# Patient Record
Sex: Female | Born: 1995 | Race: Black or African American | Hispanic: No | Marital: Single | Smoking: Never smoker
Health system: Southern US, Community
[De-identification: ages and names within clinical notes are randomized; demographics above are authoritative.]

## PROBLEM LIST (undated history)

## (undated) ENCOUNTER — Emergency Department (HOSPITAL_COMMUNITY): Admission: EM | Payer: Medicaid Other

## (undated) ENCOUNTER — Inpatient Hospital Stay (HOSPITAL_COMMUNITY): Payer: Self-pay

## (undated) DIAGNOSIS — A749 Chlamydial infection, unspecified: Secondary | ICD-10-CM

## (undated) DIAGNOSIS — F419 Anxiety disorder, unspecified: Secondary | ICD-10-CM

## (undated) DIAGNOSIS — F32A Depression, unspecified: Secondary | ICD-10-CM

## (undated) DIAGNOSIS — F329 Major depressive disorder, single episode, unspecified: Secondary | ICD-10-CM

## (undated) HISTORY — PX: CHEST SURGERY: SHX595

---
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
## 2015-12-22 ENCOUNTER — Encounter (HOSPITAL_COMMUNITY): Payer: Self-pay | Admitting: Emergency Medicine

## 2015-12-22 ENCOUNTER — Emergency Department (HOSPITAL_COMMUNITY)
Admission: EM | Admit: 2015-12-22 | Discharge: 2015-12-22 | Disposition: A | Discharge status: Home / Self Care | Payer: No Typology Code available for payment source | Attending: Emergency Medicine | Admitting: Emergency Medicine

## 2015-12-22 ENCOUNTER — Emergency Department (HOSPITAL_COMMUNITY): Payer: No Typology Code available for payment source

## 2015-12-22 DIAGNOSIS — Y939 Activity, unspecified: Secondary | ICD-10-CM | POA: Diagnosis not present

## 2015-12-22 DIAGNOSIS — R51 Headache: Secondary | ICD-10-CM | POA: Diagnosis not present

## 2015-12-22 DIAGNOSIS — Y92410 Unspecified street and highway as the place of occurrence of the external cause: Secondary | ICD-10-CM | POA: Insufficient documentation

## 2015-12-22 DIAGNOSIS — IMO0002 Reserved for concepts with insufficient information to code with codable children: Secondary | ICD-10-CM

## 2015-12-22 DIAGNOSIS — S199XXA Unspecified injury of neck, initial encounter: Secondary | ICD-10-CM | POA: Diagnosis not present

## 2015-12-22 DIAGNOSIS — S01111A Laceration without foreign body of right eyelid and periocular area, initial encounter: Secondary | ICD-10-CM | POA: Diagnosis not present

## 2015-12-22 DIAGNOSIS — Y999 Unspecified external cause status: Secondary | ICD-10-CM | POA: Diagnosis not present

## 2015-12-22 DIAGNOSIS — S0181XA Laceration without foreign body of other part of head, initial encounter: Secondary | ICD-10-CM | POA: Diagnosis present

## 2015-12-22 MED ORDER — ACETAMINOPHEN 500 MG PO TABS
1000.0000 mg | ORAL_TABLET | Freq: Once | ORAL | Status: AC
  Administered 2015-12-22: 1000 mg via ORAL
  Filled 2015-12-22: qty 2

## 2015-12-22 MED ORDER — IBUPROFEN 800 MG PO TABS
800.0000 mg | ORAL_TABLET | Freq: Once | ORAL | Status: AC
  Administered 2015-12-22: 800 mg via ORAL
  Filled 2015-12-22: qty 1

## 2015-12-22 MED ORDER — LIDOCAINE-EPINEPHRINE (PF) 1 %-1:200000 IJ SOLN
INTRAMUSCULAR | Status: AC
  Filled 2015-12-22: qty 30

## 2015-12-22 MED ORDER — LIDOCAINE-EPINEPHRINE-TETRACAINE (LET) SOLUTION
3.0000 mL | Freq: Once | NASAL | Status: DC
  Filled 2015-12-22: qty 3
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
## 2015-12-22 NOTE — Discharge Instructions (Signed)
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
## 2017-06-14 LAB — OB RESULTS CONSOLE HIV ANTIBODY (ROUTINE TESTING): HIV: NONREACTIVE

## 2017-06-14 LAB — OB RESULTS CONSOLE HEPATITIS B SURFACE ANTIGEN: Hepatitis B Surface Ag: NEGATIVE

## 2017-06-14 LAB — OB RESULTS CONSOLE GC/CHLAMYDIA
Chlamydia: NEGATIVE
Gonorrhea: POSITIVE

## 2017-06-14 LAB — OB RESULTS CONSOLE RPR: RPR: NONREACTIVE

## 2017-06-14 LAB — OB RESULTS CONSOLE RUBELLA ANTIBODY, IGM: Rubella: IMMUNE
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
## 2017-10-15 LAB — OB RESULTS CONSOLE RPR: RPR: NONREACTIVE

## 2017-10-15 LAB — OB RESULTS CONSOLE GC/CHLAMYDIA
CHLAMYDIA, DNA PROBE: NEGATIVE
GC PROBE AMP, GENITAL: NEGATIVE

## 2017-10-15 LAB — OB RESULTS CONSOLE HIV ANTIBODY (ROUTINE TESTING): HIV: NONREACTIVE

## 2017-11-07 ENCOUNTER — Encounter (HOSPITAL_COMMUNITY): Payer: Self-pay | Admitting: *Deleted

## 2017-11-07 ENCOUNTER — Inpatient Hospital Stay
Admission: AD | Admit: 2017-11-07 | Discharge: 2017-11-12 | DRG: 832 | Disposition: A | Discharge status: Home / Self Care | Payer: Medicaid Other | Source: Intra-hospital | Attending: Psychiatry | Admitting: Psychiatry

## 2017-11-07 ENCOUNTER — Other Ambulatory Visit: Payer: Self-pay

## 2017-11-07 ENCOUNTER — Inpatient Hospital Stay (HOSPITAL_COMMUNITY)
Admission: AD | Admit: 2017-11-07 | Discharge: 2017-11-07 | Disposition: A | Discharge status: Other Acute Inpatient Hospital | Payer: Medicaid Other | Source: Ambulatory Visit | Attending: Obstetrics and Gynecology | Admitting: Obstetrics and Gynecology

## 2017-11-07 DIAGNOSIS — F329 Major depressive disorder, single episode, unspecified: Secondary | ICD-10-CM | POA: Insufficient documentation

## 2017-11-07 DIAGNOSIS — F322 Major depressive disorder, single episode, severe without psychotic features: Secondary | ICD-10-CM | POA: Diagnosis present

## 2017-11-07 DIAGNOSIS — R45851 Suicidal ideations: Secondary | ICD-10-CM | POA: Diagnosis present

## 2017-11-07 DIAGNOSIS — F32A Depression, unspecified: Secondary | ICD-10-CM | POA: Diagnosis present

## 2017-11-07 DIAGNOSIS — O99343 Other mental disorders complicating pregnancy, third trimester: Principal | ICD-10-CM | POA: Diagnosis present

## 2017-11-07 DIAGNOSIS — Z3A30 30 weeks gestation of pregnancy: Secondary | ICD-10-CM | POA: Insufficient documentation

## 2017-11-07 DIAGNOSIS — O99340 Other mental disorders complicating pregnancy, unspecified trimester: Secondary | ICD-10-CM

## 2017-11-07 HISTORY — DX: Major depressive disorder, single episode, unspecified: F32.9

## 2017-11-07 HISTORY — DX: Depression, unspecified: F32.A

## 2017-11-07 LAB — URINALYSIS, ROUTINE W REFLEX MICROSCOPIC
Bilirubin Urine: NEGATIVE
Glucose, UA: NEGATIVE mg/dL
Hgb urine dipstick: NEGATIVE
Ketones, ur: 80 mg/dL — AB
Nitrite: NEGATIVE
Protein, ur: NEGATIVE mg/dL
Specific Gravity, Urine: 1.017 (ref 1.005–1.030)
pH: 7 (ref 5.0–8.0)

## 2017-11-07 MED ORDER — LACTATED RINGERS IV SOLN
INTRAVENOUS | Status: DC
  Administered 2017-11-07: 19:00:00 via INTRAVENOUS

## 2017-11-07 MED ORDER — DEXTROSE IN LACTATED RINGERS 5 % IV SOLN
Freq: Once | INTRAVENOUS | Status: AC
  Administered 2017-11-07: 18:00:00 via INTRAVENOUS
  Filled 2017-11-07: qty 1000

## 2017-11-07 NOTE — Progress Notes (Signed)
[facility]. Dr. Toni Amendlapacs is the attending provider.   Call report to [phone]. [facility].    Pt is voluntary and can be [facility].   [facility] has [facility] is arranged.   Wells GuilesSarah Lulubelle Simcoe, LCSW, LCAS Disposition CSW [facility] [phone]
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
## 2017-11-07 NOTE — MAU Provider Note (Addendum)
History     CSN: [id number]  Arrival date and time: 11/07/17 1615   First Provider Initiated Contact with Patient 11/07/17 1730      Chief Complaint  Patient presents with  . No appetite   HPI  Ms.  Desiree Sherman is a 22 y.o. year old G50P1001 female at [redacted]w[redacted]d weeks gestation who presents to MAU via EMS reporting no appetite. She reports hasn't eaten in 2 days. She reports that it is possibly because she is depressed. She was Rx'd meds for depression (Zoloft 25 mg per prenatal records on 10/15/17), but she hasn't taken them. She reports feeling sad, crying often, and doesn't have any family support. She states that her mom is in jail and she doesn't have a relationship with her father. She expresses that she has had thoughts of harming herself recently, but has no plan. She states that sometimes she doesn't "need to be here". She was also given information to schedule counseling by [facility].    Past Medical History:  Diagnosis Date  . Chlamydia    during pregnancy  . Depression     Past Surgical History:  Procedure Laterality Date  . CHEST SURGERY     fell onto glass table, glass removed  . NO PAST SURGERIES      Family History  Problem Relation Age of Onset  . Asthma Mother     Social History   Tobacco Use  . Smoking status: Never Smoker  . Smokeless tobacco: Never Used  Substance Use Topics  . Alcohol use: No  . Drug use: No    Allergies: No Known Allergies  Medications Prior to Admission  Medication Sig Dispense Refill Last Dose  . Prenatal Vit-Fe Fumarate-FA (PRENATAL MULTIVITAMIN) TABS tablet Take 1 tablet by mouth daily at 12 noon.   Past Week at Unknown time  . sertraline (ZOLOFT) 25 MG tablet Take 25 mg by mouth daily.   Past Month at Unknown time    Review of Systems  Constitutional: Positive for appetite change (purposefully not eating in 2 days ).  HENT: Negative.   Eyes: Negative.   Respiratory: Negative.   Cardiovascular: Negative.    Gastrointestinal: Negative.   Endocrine: Negative.   Genitourinary: Negative.   Musculoskeletal: Negative.   Skin: Negative.   Allergic/Immunologic: Negative.   Neurological: Negative.   Hematological: Negative.   Psychiatric/Behavioral: Positive for suicidal ideas.   Physical Exam   Blood pressure 127/73, pulse 91, temperature 97.9 F (36.6 C), temperature source Oral, resp. rate 20, height 5\' 6"  (1.676 m), weight 156 lb 8 oz (71 kg), SpO2 99 %.  Physical Exam  Nursing note and vitals reviewed. Constitutional: She is oriented to person, place, and time. She appears well-developed and well-nourished.  HENT:  Head: Normocephalic and atraumatic.  Eyes: Pupils are equal, round, and reactive to light. Conjunctivae are normal.  Neck: Normal range of motion.  Cardiovascular: Normal rate, regular rhythm and normal heart sounds.  Respiratory: Effort normal and breath sounds normal.  GI: Soft. Bowel sounds are normal.  Genitourinary:  Genitourinary Comments: Pelvic deferred  Musculoskeletal: Normal range of motion.  Neurological: She is oriented to person, place, and time.  Skin: Skin is warm and dry.  Psychiatric: Her speech is normal. Her affect is blunt. She is withdrawn. Cognition and memory are normal. She expresses inappropriate judgment (purposefully not eating for last 2 days). She expresses suicidal ideation. She expresses suicidal plans.    MAU Course  Procedures  MDM Telepsych Consult Sitter  MVI in D5LR 1000 ml @ 999 ml/hr then LR 1000 ml @ 500 ml/hr NST - FHR: 130 bpm / moderate variability / accels present / decels absent / TOCO: irregular every 4-7 mins -- none since IVFs GC/CT on urine -- pending  * TC to Danae OrleansVanessa York, [facility] counselor @ [phone] for clarification on [facility] recommendations - informed by Ms. York that the CSW is in the process of faxing the patient's information to other facilities that can care for her mental health requirements and OB status. Someone will  call MAU to notify when and where the patient will be transferred to. Patient to stay in MAU until transfer confirmed and arranged.  *Consult with Dr. Senaida Oresichardson @ 1945 - notified of patient's complaints, assessments, and [facility] recommendations for inpatient admission. Dr. Senaida Oresichardson to call Arvilla MeresLealia, RN, [facility] House Supervisor to arrange holding bed for patient until admission facility secured.  Report given to and care assumed by [name], CNM @ 2200  [name], CNM  11/07/2017 10:00 PM Assessment and Plan  Patient requested ginger ale. Offered sandwich, only wanted crackers  Transferred to [facility] for psychiatric care  MD to follow  [name] CNM

## 2017-11-07 NOTE — Progress Notes (Signed)
RN spoke to Rocky MountAngel, hospital social worker regarding pt's feelings of sadness, hopelessness, frequent crying, and suicidal thoughts.

## 2017-11-07 NOTE — H&P (Addendum)
Desiree Sherman is a 22 y.o. female G2P1001 at 30 0/7 weeks (EDD 01/16/18 by LMP c/w 10 week [place]) brought in to MAU by EMS after a family friend called our office to report that the patient had been locked in her bedroom for 2 days, not eating or drinking and not taking care of her 22 year old.  We advised her to call EMS to bring the patient to the hospital. Prenatal care thus far significant for increasing depression at 26 weeks with intervention of zoloft and counseling.  Patient reported at next visit she was taking the zoloft and had seen the counselor, but tonight states she never did.   She also tested positive for gonorrhea 11/18 and was treated with ceftriaxone and azithromycin, stated partner was treated as well.   OB History    Gravida  2   Para  1   Term  1   Preterm      AB      Living  1     SAB      TAB      Ectopic      Multiple      Live Births  1         58 NSVD 6#6oz  Past Medical History:  Diagnosis Date  . Chlamydia    during pregnancy  . Depression    Past Surgical History:  Procedure Laterality Date  . CHEST SURGERY     fell onto glass table, glass removed  . NO PAST SURGERIES     Family History: family history includes Asthma in her mother. Social History:  reports that she has never smoked. She has never used smokeless tobacco. She reports that she does not drink alcohol or use drugs.     Maternal Diabetes: No Genetic Screening: Normal Maternal Ultrasounds/Referrals: Normal Fetal Ultrasounds or other Referrals:  None Maternal Substance Abuse:  No Significant Maternal Medications:  None Significant Maternal Lab Results:  Lab values include: Other:   GC positive 11/18 Other Comments:  None  Review of Systems  Gastrointestinal: Negative for abdominal pain.  Psychiatric/Behavioral: Positive for depression and suicidal ideas.   Maternal Medical History:  Fetal activity: Perceived fetal activity is normal.    Prenatal Complications -  Diabetes: none.      Blood pressure 127/73, pulse 91, temperature 97.9 F (36.6 C), temperature source Oral, resp. rate 20, height 5\' 6"  (1.676 m), weight 156 lb 8 oz (71 kg), SpO2 99 %, unknown if currently breastfeeding. Maternal Exam:  Abdomen: Patient reports no abdominal tenderness.  FHR category 1  Physical Exam  Constitutional: She appears well-developed.  Respiratory: Effort normal.  Psychiatric:  Pt flat and nonverbal for most part    Prenatal labs: ABO, Rh:  O positive Antibody:  negative Rubella:  Immune RPR:   NR HBsAg:   Neg HIV:   neg GBS:  unknown  Hgb AA One hour GCT 66 First trimester screen negative  Assessment/Plan: Pt assessed by tele-psychiatry consult and given suicidal ideation and not eating for 2 days is recommended to have inpatient admission.  Pt currently with sitter by the bedside.  No inpatient beds in [facility] available, social work has been consulted to work on placement and House Coverage notified to assist in finding a room to hold patient with sitter until placement can be found.   Will collect TOC for gonorrhea.  Given a liter of IVF with MVI given poor po intake of last several days.  Oliver PilaKathy W Lucille Crichlow 11/07/2017, 8:11 PM

## 2017-11-07 NOTE — MAU Note (Addendum)
Pt to MAU via EMS with c/o no appetite, reports hasn't eaten in 2 days.  Reports that lack of appetite may be because she's depressed.  Reports gven meds by OB/Gyn for depression but hasn't taken them.  Reports that she's  sad, cries often, and doesn't have any family support.  Expresses feelings of hopelessness.  States her mom is incarcerated and she doesn't have a relationship with her father.  Pt also reports that she's had thoughts of harming herself recently, but has no plan.  States sometimes she doesn't "need to be here". Pt teary @ times while talking with nurse.

## 2017-11-07 NOTE — Progress Notes (Addendum)
ED [facility] CSW received phone call from pt's RN. Pt evaluated by telepsych. Decision has not been made about pt's psychiatric status. Pt's RN reported that pt is having feelings of depression, hopelessness, crying frequently, and suicidal thoughts. ED [facility] CS[facility] will await [facility] decision before assessing pt.   CSW will continue to follow pt.   Update: CSW spoke [facility]. [facility] is continuing to work with pt. CSW updated [facility] CS[facility] and provided her with number for the pt. Pt is wanting to speak with social work.   Montine CircleKelsy Yaa Donnellan, Silverio LayLCSWA  Emergency Room  [phone]

## 2017-11-07 NOTE — Progress Notes (Signed)
RN spoke to Desiree Sherman, social worker @ [facility] regarding pt arrival and status.  [name] requesting to speak to pt directly, pt given phone & informed SW will be calling her.  Pt verbalized understanding & agreeable.

## 2017-11-07 NOTE — Progress Notes (Signed)
Pt meets inpatient criteria per Assunta FoundShuvon Rankin, NP. Ref[facility]. CSW left a voice message with Jacki ConesLaurie in admissions at the facility [phone]) requesting she call disposition back tomorrow (office hours are 8am to 4pm).  Disposition CSWs will continue to assist with placement needs.   Wells GuilesSarah Deitrich Steve, LCSW, LCAS Disposition CSW [facility] [phone]

## 2017-11-08 ENCOUNTER — Other Ambulatory Visit: Payer: Self-pay

## 2017-11-08 DIAGNOSIS — F32A Depression, unspecified: Secondary | ICD-10-CM | POA: Diagnosis present

## 2017-11-08 DIAGNOSIS — F322 Major depressive disorder, single episode, severe without psychotic features: Secondary | ICD-10-CM

## 2017-11-08 DIAGNOSIS — F329 Major depressive disorder, single episode, unspecified: Secondary | ICD-10-CM | POA: Diagnosis present

## 2017-11-08 MED ORDER — ACETAMINOPHEN 325 MG PO TABS: 650.0000 mg | ORAL_TABLET | Freq: Four times a day (QID) | ORAL | Status: DC | PRN

## 2017-11-08 MED ORDER — ALUM & MAG HYDROXIDE-SIMETH 200-200-20 MG/5ML PO SUSP: 30.0000 mL | ORAL | Status: DC | PRN

## 2017-11-08 MED ORDER — MAGNESIUM HYDROXIDE 400 MG/5ML PO SUSP: 30.0000 mL | Freq: Every day | ORAL | Status: DC | PRN

## 2017-11-08 MED ORDER — ENSURE ENLIVE PO LIQD
237.0000 mL | Freq: Two times a day (BID) | ORAL | Status: DC
  Administered 2017-11-10 – 2017-11-12 (×5): 237 mL via ORAL

## 2017-11-08 MED ORDER — PRENATAL MULTIVITAMIN CH
1.0000 | ORAL_TABLET | Freq: Every day | ORAL | Status: DC
  Administered 2017-11-09 – 2017-11-12 (×4): 1 via ORAL
  Filled 2017-11-08 (×5): qty 1

## 2017-11-08 MED ORDER — SERTRALINE HCL 25 MG PO TABS
50.0000 mg | ORAL_TABLET | Freq: Every day | ORAL | Status: DC
  Administered 2017-11-08 – 2017-11-12 (×5): 50 mg via ORAL
  Filled 2017-11-08 (×5): qty 2

## 2017-11-08 NOTE — Progress Notes (Addendum)
Patient ID: [name], female   DOB: [birth year], 22 y.o.   MRN: [id number] 22 year old [place] American female admitted voluntarily for depression and self-neglect. [facility] Upon admission, mood and affect are depressed, appearance is disheveled, and patient is cooperative with admission process. Patient reports she is [redacted] weeks pregnant and about a month ago began experiencing increased depression. Patient locked herself in her room and did not eat or drink anything for 2 days. Documentation suggests that patient was experiencing SI but patient denies SI upon admission. Denies family Hx of suicide or mental illness. Reports mother is incarcerated and father is not in her life. Patient currently has 22 year old daughter at home where she lives with her daughter's aunt. Patient reports no social or emotional support which is worsening feelings of depression and loneliness. Denies prior suicide attempts. Reports father of baby not involved. Patient reports being overwhelmed with taking care of a toddler and being pregnant. Documentation indicates patient was prescribed Zoloft a month ago but patient is vague regarding her compliance. Patient was recently treated for gonorrhea and chlamydia. Denies use of drugs or ETOH. Patient is not a smoker. Denies pain. Minimally verbal and guarded throughout assessment. Denies remarkable medical Hx. Signed all forms including voluntary admission form. Inventory completed. No contraband found. Oriented to unit and room. Hygiene products provided. Dr. Toni Amendlapacs paged for orders. Awaiting return call at this time.  Q 15 minute checks initiated. Will continue to monitor throughout the shift. Patient slept 3.75 hours. No apparent distress. Patient affect is worried or frightened. Continues to be minimally verbal. Will endorse care to oncoming shift.

## 2017-11-08 NOTE — H&P (Signed)
Psychiatric Admission Assessment Adult  Patient Identification: Desiree Sherman MRN:  [id number] Date of Evaluation:  11/08/2017 Chief Complaint:   Depression Principal Diagnosis: Severe major depression, single episode, without psychotic features (HCC) Diagnosis:   Patient Active Problem List   Diagnosis Date Noted  . Depression [F32.9] 11/08/2017  . Severe major depression, single episode, without psychotic features (HCC) [F32.2] 11/08/2017  . Depression affecting pregnancy, antepartum [O99.340, F32.9] 11/07/2017  . Depression with suicidal ideation [F32.9, R45.851] 11/07/2017   History of Present Illness: 22 year old woman referred from [facility] in [place] because of concerns about depression.  Patient seen chart reviewed.  Patient reports that she had only been depressed for 2 days and during that time was not eating.  Review of the chart suggest this problem is been present for at least 4 weeks.  When confronted with that the patient agrees that she has been depressed but continues to minimize symptoms.  Chart says she had made suicidal statements which she absolutely denies right now.  Denies any psychotic symptoms denies problems sleeping.  She says she is under a lot of stress but does not want to talk about it with me.  She admits that she had not been taking the antidepressant medicine that had been prescribed for her because she said it made her sick to her stomach.  Denies alcohol or drug abuse. Associated Signs/Symptoms: Depression Symptoms:  depressed mood, anhedonia, psychomotor retardation, decreased appetite, (Hypo) Manic Symptoms:  None Anxiety Symptoms:  None Psychotic Symptoms:  None PTSD Symptoms: Negative Total Time spent with patient: 1 hour  Past Psychiatric History: Patient denies past psychiatric history never seen a counselor or therapist never been in a psychiatric hospital.  Never tried to kill herself.  Was prescribed Zoloft but took it only  one time before deciding it made her sick to her stomach.  Is the patient at risk to self? Yes.    Has the patient been a risk to self in the past 6 months? No.  Has the patient been a risk to self within the distant past? No.  Is the patient a risk to others? No.  Has the patient been a risk to others in the past 6 months? No.  Has the patient been a risk to others within the distant past? No.   Prior Inpatient Therapy:   Prior Outpatient Therapy:    Alcohol Screening: 1. How often do you have a drink containing alcohol?: Never 2. How many drinks containing alcohol do you have on a typical day when you are drinking?: 1 or 2 3. How often do you have six or more drinks on one occasion?: Never AUDIT-C Score: 0 4. How often during the last year have you found that you were not able to stop drinking once you had started?: Never 5. How often during the last year have you failed to do what was normally expected from you becasue of drinking?: Never 6. How often during the last year have you needed a first drink in the morning to get yourself going after a heavy drinking session?: Never 7. How often during the last year have you had a feeling of guilt of remorse after drinking?: Never 8. How often during the last year have you been unable to remember what happened the night before because you had been drinking?: Never 9. Have you or someone else been injured as a result of your drinking?: No 10. Has a relative or friend or a doctor or another Medical laboratory scientific officer  been concerned about your drinking or suggested you cut down?: No Alcohol Use Disorder Identification Test Final Score (AUDIT): 0 Intervention/Follow-up: AUDIT Score <7 follow-up not indicated Substance Abuse History in the last 12 months:  No. Consequences of Substance Abuse: Negative Previous Psychotropic Medications: No  Psychological Evaluations: No  Past Medical History:  Past Medical History:  Diagnosis Date  . Chlamydia    during  pregnancy  . Depression     Past Surgical History:  Procedure Laterality Date  . CHEST SURGERY     fell onto glass table, glass removed  . NO PAST SURGERIES     Family History:  Family History  Problem Relation Age of Onset  . Asthma Mother    Family Psychiatric  History: Denies there being any Tobacco Screening:   Social History:  Social History   Substance and Sexual Activity  Alcohol Use No     Social History   Substance and Sexual Activity  Drug Use No    Additional Social History: Marital status: Single Are you sexually active?: Yes What is your sexual orientation?: Heterosexual Has your sexual activity been affected by drugs, alcohol, medication, or emotional stress?: Yes, emotional stress Does patient have children?: Yes How many children?: 1 How is patient's relationship with their children?: Pt has a 57 yo daughter.  She has a a good relationship with her daughter    Pain Medications: denies Prescriptions: Zoloft, Prenatal vitamins Over the Counter: denies History of alcohol / drug use?: No history of alcohol / drug abuse Negative Consequences of Use: (N/A) Withdrawal Symptoms: (N/A)                    Allergies:  No Known Allergies Lab Results:  Results for orders placed or performed during the hospital encounter of 11/07/17 (from the past 48 hour(s))  Urinalysis, Routine w reflex microscopic     Status: Abnormal   Collection Time: 11/07/17  4:22 PM  Result Value Ref Range   Color, Urine AMBER (A) YELLOW    Comment: BIOCHEMICALS MAY BE AFFECTED BY COLOR   APPearance HAZY (A) CLEAR   Specific Gravity, Urine 1.017 1.005 - 1.030   pH 7.0 5.0 - 8.0   Glucose, UA NEGATIVE NEGATIVE mg/dL   Hgb urine dipstick NEGATIVE NEGATIVE   Bilirubin Urine NEGATIVE NEGATIVE   Ketones, ur 80 (A) NEGATIVE mg/dL   Protein, ur NEGATIVE NEGATIVE mg/dL   Nitrite NEGATIVE NEGATIVE   Leukocytes, UA MODERATE (A) NEGATIVE   RBC / HPF 0-5 0 - 5 RBC/hpf   WBC, UA 6-10  0 - 5 WBC/hpf   Bacteria, UA RARE (A) NONE SEEN   Squamous Epithelial / LPF 0-5 0 - 5    Comment: Please note change in reference range.   Mucus PRESENT     Comment: Performed at [facility], [address], [place], [place] [address]    Blood Alcohol level:  No results found for: [facility]  Metabolic Disorder Labs:  No results found for: HGBA1C, MPG No results found for: PROLACTIN No results found for: CHOL, TRIG, HDL, CHOLHDL, VLDL, LDLCALC  Current Medications: Current Facility-Administered Medications  Medication Dose Route Frequency Provider Last Rate Last Dose  . acetaminophen (TYLENOL) tablet 650 mg  650 mg Oral Q6H PRN Clapacs, John T, MD      . alum & mag hydroxide-simeth (MAALOX/MYLANTA) 200-200-20 MG/5ML suspension 30 mL  30 mL Oral Q4H PRN Clapacs, Jackquline Denmark, MD      . feeding supplement (ENSURE  ENLIVE) (ENSURE ENLIVE) liquid 237 mL  237 mL Oral BID BM Clapacs, John T, MD      . magnesium hydroxide (MILK OF MAGNESIA) suspension 30 mL  30 mL Oral Daily PRN Clapacs, John T, MD      . prenatal multivitamin tablet 1 tablet  1 tablet Oral Q1200 Clapacs, Jackquline DenmarkJohn T, MD       PTA Medications: Medications Prior to Admission  Medication Sig Dispense Refill Last Dose  . Prenatal Vit-Fe Fumarate-FA (PRENATAL MULTIVITAMIN) TABS tablet Take 1 tablet by mouth daily at 12 noon.   11/07/2017 at Unknown time  . sertraline (ZOLOFT) 25 MG tablet Take 25 mg by mouth daily.   11/07/2017 at Unknown time    Musculoskeletal: Strength & Muscle Tone: within normal limits Gait & Station: normal Patient leans: N/A  Psychiatric Specialty Exam: Physical Exam  Nursing note and vitals reviewed. Constitutional: She appears well-developed and well-nourished.  HENT:  Head: Normocephalic and atraumatic.  Eyes: Pupils are equal, round, and reactive to light. Conjunctivae are normal.  Neck: Normal range of motion.  Cardiovascular: Normal heart sounds.  Respiratory: Effort normal.  GI: Soft.     Musculoskeletal: Normal range of motion.  Neurological: She is alert.  Skin: Skin is warm and dry.  Psychiatric: Her affect is blunt. Her speech is delayed. She is slowed. Cognition and memory are impaired. She expresses impulsivity. She exhibits a depressed mood. She expresses no homicidal and no suicidal ideation.    Review of Systems  Constitutional: Negative.   HENT: Negative.   Eyes: Negative.   Respiratory: Negative.   Cardiovascular: Negative.   Gastrointestinal: Negative.   Musculoskeletal: Negative.   Skin: Negative.   Neurological: Negative.   Psychiatric/Behavioral: Positive for depression. Negative for hallucinations, memory loss, substance abuse and suicidal ideas. The patient is nervous/anxious. The patient does not have insomnia.     Blood pressure 103/73, pulse 99, temperature 98.3 F (36.8 C), temperature source Oral, resp. rate 18, height 5\' 6"  (1.676 m), weight 160 lb 8 oz (72.8 kg), SpO2 100 %, unknown if currently breastfeeding.Body mass index is 25.91 kg/m.  General Appearance: Casual  Eye Contact:  Minimal  Speech:  Slow  Volume:  Decreased  Mood:  Depressed  Affect:  Congruent  Thought Process:  Goal Directed  Orientation:  Full (Time, Place, and Person)  Thought Content:  Logical  Suicidal Thoughts:  No  Homicidal Thoughts:  No  Memory:  Immediate;   Fair Recent;   Fair Remote;   Fair  Judgement:  Impaired  Insight:  Shallow  Psychomotor Activity:  Decreased  Concentration:  Concentration: Fair  Recall:  FiservFair  Fund of Knowledge:  Fair  Language:  Fair  Akathisia:  No  Handed:  Right  AIMS (if indicated):     Assets:  Communication Skills Desire for Improvement Housing Physical Health Resilience  ADL's:  Intact  Cognition:  WNL  Sleep:  Number of Hours: 3.75    Treatment Plan Summary: Daily contact with patient to assess and evaluate symptoms and progress in treatment, Medication management and Plan 22 year old woman with several  weeks of depressed mood.  In the interview with me the patient is clearly minimizing her symptoms out of a desire to be released from the hospital.  Tried to do some psychoeducation about major depression and its risks in pregnancy and the options for treatment.  Patient is only slightly engaged in making decisions.  Really just wants to focus on discharge.  Right now she  is staying pretty isolated.  She seems ambivalent about whether she would agree to take medication.  For now I will continue the Zoloft 50 mg a day encourage group participation and full evaluation over the weekend before making further decisions.  Observation Level/Precautions:  15 minute checks  Laboratory:  Chemistry Profile  Psychotherapy:    Medications:    Consultations:    Discharge Concerns:    Estimated LOS:  Other:     Physician Treatment Plan for Primary Diagnosis: Severe major depression, single episode, without psychotic features (HCC) Long Term Goal(s): Improvement in symptoms so as ready for discharge  Short Term Goals: Ability to identify changes in lifestyle to reduce recurrence of condition will improve and Ability to verbalize feelings will improve  Physician Treatment Plan for Secondary Diagnosis: Principal Problem:   Severe major depression, single episode, without psychotic features (HCC) Active Problems:   Depression  Long Term Goal(s): Improvement in symptoms so as ready for discharge  Short Term Goals: Ability to maintain clinical measurements within normal limits will improve and Compliance with prescribed medications will improve  I certify that inpatient services furnished can reasonably be expected to improve the patient's condition.    Mordecai Rasmussen, MD 4/26/20195:51 PM

## 2017-11-08 NOTE — Progress Notes (Signed)
Initial Nutrition Assessment  DOCUMENTATION CODES:   Not applicable  INTERVENTION:   Ensure Enlive po BID, each supplement provides 350 kcal and 20 grams of protein  Prenatal multivitamin daily   NUTRITION DIAGNOSIS:   Increased nutrient needs related to other (see comment)(pt is [redacted] weeks pregnant ) as evidenced by increased estimated needs from energy and protein.  GOAL:   Patient will meet greater than or equal to 90% of their needs  MONITOR:   PO intake, Supplement acceptance  REASON FOR ASSESSMENT:   Malnutrition Screening Tool    ASSESSMENT:   22 year old African American female [redacted] weeks pregnant admitted voluntarily for depression and self-neglect.   Pt with poor appetite and oral intake for 2 days pta per chart review. Pt noted to be [redacted] weeks pregnant. No documented wt history in chart. RD will add supplements and multivitamin to help pt meet her estimated needs. Pt documented to be eating <25% of meals.   Medications reviewed:  Labs reviewed:    Diet Order:  Diet regular Room service appropriate? Yes; Fluid consistency: Thin  EDUCATION NEEDS:   Not appropriate for education at this time  Skin:  Skin Assessment: Reviewed RN Assessment  Last BM:  4/24  Height:   Ht Readings from Last 1 Encounters:  11/08/17 5\' 6"  (1.676 m)    Weight:   Wt Readings from Last 1 Encounters:  11/08/17 160 lb 8 oz (72.8 kg)    Ideal Body Weight:  59 kg  BMI:  Body mass index is 25.91 kg/m.  Estimated Nutritional Needs:   Kcal:  2200-2500kcal/day   Protein:  73-80g/day   Fluid:  >1.8L/day   Betsey Holidayasey Vasili Fok MS, RD, LDN Pager #[phone]- [phone] After Hours Pager: [phone]

## 2017-11-08 NOTE — BH Assessment (Addendum)
TTS spoke with charge nurse Alric Ran([place]) to get tracker information for pt being transferred after learning [facility] has approved admittance. TTS communicated this information to [facility] and [facility] (patient access) for preadmit.

## 2017-11-08 NOTE — Progress Notes (Signed)
Recreation Therapy Notes   Date: 11/08/2017  Time: 9:30 am   Location: Craft Room   Behavioral response: N/A   Intervention Topic: Emotions  Discussion/Intervention: Patient did not attend group.   Clinical Observations/Feedback:  Patient did not attend group.   Marquise Wicke LRT/CTRS         Anuja Manka 11/08/2017 10:24 AM 

## 2017-11-08 NOTE — Tx Team (Signed)
Initial Treatment Plan 11/08/2017 1:08 AM Desiree Sherman UJW:[id number]    PATIENT STRESSORS: Financial difficulties Other: [redacted] weeks pregnant with a 655 year old at home   PATIENT STRENGTHS: Ability for insight Capable of independent living Motivation for treatment/growth Other: responsible for child   PATIENT IDENTIFIED PROBLEMS: Depression 11/08/2017  Self-Neglect 11/08/2017                   DISCHARGE CRITERIA:  Ability to meet basic life and health needs Adequate post-discharge living arrangements Improved stabilization in mood, thinking, and/or behavior Motivation to continue treatment in a less acute level of care Need for constant or close observation no longer present Verbal commitment to aftercare and medication compliance  PRELIMINARY DISCHARGE PLAN: Attend aftercare/continuing care group Outpatient therapy  PATIENT/FAMILY INVOLVEMENT: This treatment plan has been presented to and reviewed with the patient, Desiree Sherman, and/or family member.  The patient and family have been given the opportunity to ask questions and make suggestions.  Galen ManilaAlexis E Nastasia Kage, RN 11/08/2017, 1:08 AM

## 2017-11-08 NOTE — Tx Team (Addendum)
Interdisciplinary Treatment and Diagnostic Plan Update  11/08/2017 Time of Session: 10:30 AM Desiree Sherman MRN: [id number]  Principal Diagnosis: <principal problem not specified>  Secondary Diagnoses: Active Problems:   * No active hospital problems. *   Current Medications:  No current facility-administered medications for this encounter.    PTA Medications: Medications Prior to Admission  Medication Sig Dispense Refill Last Dose  . Prenatal Vit-Fe Fumarate-FA (PRENATAL MULTIVITAMIN) TABS tablet Take 1 tablet by mouth daily at 12 noon.   11/07/2017 at Unknown time  . sertraline (ZOLOFT) 25 MG tablet Take 25 mg by mouth daily.   11/07/2017 at Unknown time    Patient Stressors: Financial difficulties Other: [redacted] weeks pregnant with a 14 year old at home  Patient Strengths: Ability for insight Capable of independent living Motivation for treatment/growth Other: responsible for child  Treatment Modalities: Medication Management, Group therapy, Case management,  1 to 1 session with clinician, Psychoeducation, Recreational therapy.   Physician Treatment Plan for Primary Diagnosis: <principal problem not specified> Long Term Goal(s):     Short Term Goals:    Medication Management: Evaluate patient's response, side effects, and tolerance of medication regimen.  Therapeutic Interventions: 1 to 1 sessions, Unit Group sessions and Medication administration.  Evaluation of Outcomes: Progressing  Physician Treatment Plan for Secondary Diagnosis: Active Problems:   * No active hospital problems. *  Long Term Goal(s):     Short Term Goals:       Medication Management: Evaluate patient's response, side effects, and tolerance of medication regimen.  Therapeutic Interventions: 1 to 1 sessions, Unit Group sessions and Medication administration.  Evaluation of Outcomes: Progressing   RN Treatment Plan for Primary Diagnosis: <principal problem not specified> Long Term Goal(s):  Knowledge of disease and therapeutic regimen to maintain health will improve  Short Term Goals: Ability to demonstrate self-control, Ability to verbalize feelings will improve and Ability to disclose and discuss suicidal ideas  Medication Management: RN will administer medications as ordered by provider, will assess and evaluate patient's response and provide education to patient for prescribed medication. RN will report any adverse and/or side effects to prescribing provider.  Therapeutic Interventions: 1 on 1 counseling sessions, Psychoeducation, Medication administration, Evaluate responses to treatment, Monitor vital signs and CBGs as ordered, Perform/monitor CIWA, COWS, AIMS and Fall Risk screenings as ordered, Perform wound care treatments as ordered.  Evaluation of Outcomes: Progressing   LCSW Treatment Plan for Primary Diagnosis: <principal problem not specified> Long Term Goal(s): Safe transition to appropriate next level of care at discharge, Engage patient in therapeutic group addressing interpersonal concerns.  Short Term Goals: Engage patient in aftercare planning with referrals and resources and Increase skills for wellness and recovery  Therapeutic Interventions: Assess for all discharge needs, 1 to 1 time with Social worker, Explore available resources and support systems, Assess for adequacy in community support network, Educate family and significant other(s) on suicide prevention, Complete Psychosocial Assessment, Interpersonal group therapy.  Evaluation of Outcomes: Progressing   Progress in Treatment: Attending groups: No. Participating in groups: No. Taking medication as prescribed: Yes. Toleration medication: Yes. Family/Significant other contact made: No, will contact:  CSW will contact identified support person, Payton Emerald. Patient understands diagnosis: Yes. Discussing patient identified problems/goals with staff: Yes. Medical problems stabilized or resolved:  Yes. Denies suicidal/homicidal ideation: Yes. Issues/concerns per patient self-inventory: No. Other: n/a  New problem(s) identified: No, Describe:  No new problems identified.  New Short Term/Long Term Goal(s): "I want to go home so I can be  with my daughter"  Discharge Plan or Barriers: Pt will discharge back to her home with [facility] [place]  Reason for Continuation of Hospitalization: Depression Medication stabilization  Estimated Length of Stay: 3-5 days  Attendees: Patient: 11/08/2017 11:50 AM  Physician: Mordecai RasmussenJohn Clapacs, MD 11/08/2017 11:50 AM  Nursing: Leonia ReaderPhyllis Cobb, RN 11/08/2017 11:50 AM  RN Care Manager: 11/08/2017 11:50 AM  Social Worker: Huey RomansSonya Lyndsy Gilberto, LCSW 11/08/2017 11:50 AM  Recreational Therapist: Garret ReddishShay Outlaw, LRT 11/08/2017 11:50 AM  Other: Johny Shearsassandra Jarrett, LCSWA 11/08/2017 11:50 AM  Other:  11/08/2017 11:50 AM  Other: 11/08/2017 11:50 AM    Scribe for Treatment Team: Alease FrameSonya S Fraidy Mccarrick, LCSW 11/08/2017 11:50 AM

## 2017-11-08 NOTE — B[facility] Group Notes (Signed)
11/08/2017 1PM  Type of Therapy and Topic:  Group Therapy:  Feelings around Relapse and Recovery  Participation Level:  Did Not Attend   Description of Group:    Patients in this group will discuss emotions they experience before and after a relapse. They will process how experiencing these feelings, or avoidance of experiencing them, relates to having a relapse. Facilitator will guide patients to explore emotions they have related to recovery. Patients will be encouraged to process which emotions are more powerful. They will be guided to discuss the emotional reaction significant others in their lives may have to patients' relapse or recovery. Patients will be assisted in exploring ways to respond to the emotions of others without this contributing to a relapse.  Therapeutic Goals: 1. Patient will identify two or more emotions that lead to a relapse for them 2. Patient will identify two emotions that result when they relapse 3. Patient will identify two emotions related to recovery 4. Patient will demonstrate ability to communicate their needs through discussion and/or role plays   Summary of Patient Progress: Patient was encouraged and invited to attend group. Patient did not attend group. Social worker will continue to encourage group participation in the future.      Therapeutic Modalities:   Cognitive Behavioral Therapy Solution-Focused Therapy Assertiveness Training Relapse Prevention Therapy   Johny ShearsCassandra  Nollie Shiflett, Alexander MtLCSW 11/08/2017 2:31 PM
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
## 2017-11-08 NOTE — [facility] Suicide Risk Assessment (Signed)
[facility] Admission Suicide Risk Assessment   Nursing information obtained from:  Patient Demographic factors:  Adolescent or young adult, Low socioeconomic status, Unemployed Current Mental Status:  NA(patient denies SI) Loss Factors:  Financial problems / change in socioeconomic status Historical Factors:  NA Risk Reduction Factors:  Pregnancy, Responsible for children under 22 years of age, Living with another person, especially a relative  Total Time spent with patient: 1 hour Principal Problem: Severe major depression, single episode, without psychotic features (HCC) Diagnosis:   Patient Active Problem List   Diagnosis Date Noted  . Depression [F32.9] 11/08/2017  . Severe major depression, single episode, without psychotic features (HCC) [F32.2] 11/08/2017  . Depression affecting pregnancy, antepartum [O99.340, F32.9] 11/07/2017  . Depression with suicidal ideation [F32.9, R45.851] 11/07/2017   Subjective Data: Patient [facility] from [facility]  Secondhand reports that she had talked about suicide which she absolutely denies now.  Does have a history of current major depression no evidence of psychosis.  Has been resistant to engaging in treatment.  Has what sounds like minimal support outside the hospital.  No evidence of psychosis.  Continued Clinical Symptoms:  Alcohol Use Disorder Identification Test Final Score (AUDIT): 0 The "Alcohol Use Disorders Identification Test", Guidelines for Use in Primary Care, Second Edition.  World Science writer [facility]). Score between 0-7:  no or low risk or alcohol related problems. Score between 8-15:  moderate risk of alcohol related problems. Score between 16-19:  high risk of alcohol related problems. Score 20 or above:  warrants further diagnostic evaluation for alcohol dependence and treatment.   CLINICAL FACTORS:   Depression:   Anhedonia   Musculoskeletal: Strength & Muscle Tone: within normal limits Gait & Station:  normal Patient leans: N/A  Psychiatric Specialty Exam: Physical Exam  Nursing note and vitals reviewed. Constitutional: She appears well-developed and well-nourished.  HENT:  Head: Normocephalic and atraumatic.  Eyes: Pupils are equal, round, and reactive to light. Conjunctivae are normal.  Neck: Normal range of motion.  Cardiovascular: Regular rhythm and normal heart sounds.  Respiratory: Effort normal. No respiratory distress.  GI: Soft.    Musculoskeletal: Normal range of motion.  Neurological: She is alert.  Skin: Skin is warm and dry.  Psychiatric: Her affect is blunt. Her speech is delayed. She is slowed. Thought content is not paranoid. Cognition and memory are normal. She expresses impulsivity. She exhibits a depressed mood. She expresses no suicidal ideation.    Review of Systems  Constitutional: Negative.   HENT: Negative.   Eyes: Negative.   Respiratory: Negative.   Cardiovascular: Negative.   Gastrointestinal: Negative.   Musculoskeletal: Negative.   Skin: Negative.   Neurological: Negative.   Psychiatric/Behavioral: Positive for depression. Negative for hallucinations, memory loss, substance abuse and suicidal ideas. The patient is not nervous/anxious and does not have insomnia.     Blood pressure 103/73, pulse 99, temperature 98.3 F (36.8 C), temperature source Oral, resp. rate 18, height 5\' 6"  (1.676 m), weight 160 lb 8 oz (72.8 kg), SpO2 100 %, unknown if currently breastfeeding.Body mass index is 25.91 kg/m.  General Appearance: Casual  Eye Contact:  Minimal  Speech:  Slow  Volume:  Decreased  Mood:  Depressed  Affect:  Constricted  Thought Process:  Goal Directed  Orientation:  Full (Time, Place, and Person)  Thought Content:  Logical  Suicidal Thoughts:  No  Homicidal Thoughts:  No  Memory:  Immediate;   Fair Recent;   Fair Remote;   Fair  Judgement:  Impaired  Insight:  Shallow  Psychomotor Activity:  Decreased  Concentration:  Concentration:  Fair  Recall:  FiservFair  Fund of Knowledge:  Fair  Language:  Fair  Akathisia:  No  Handed:  Right  AIMS (if indicated):     Assets:  Desire for Improvement Housing Physical Health Resilience  ADL's:  Intact  Cognition:  WNL  Sleep:  Number of Hours: 3.75      COGNITIVE FEATURES THAT CONTRIBUTE TO RISK:  Polarized thinking    SUICIDE RISK:   Mild:  Suicidal ideation of limited frequency, intensity, duration, and specificity.  There are no identifiable plans, no associated intent, mild dysphoria and related symptoms, good self-control (both objective and subjective assessment), few other risk factors, and identifiable protective factors, including available and accessible social support.  PLAN OF CARE: Patient is admitted to the psychiatric ward and is on 15-minute checks.  Restart his Zoloft 50 mg a day.  Engage in individual and group counseling and assessment daily with ongoing reassessment of suicidality.  I certify that inpatient services furnished can reasonably be expected to improve the patient's condition.   Mordecai RasmussenJohn Clapacs, MD 11/08/2017, 5:56 PM

## 2017-11-08 NOTE — B[facility] Suicide Risk Assessment (Signed)
[facility] INPATIENT:  Family/Significant Other Suicide Prevention Education  Suicide Prevention Education:  Education Completed; Payton EmeraldSwannie Winn at [phone] has been identified by the patient as the family member/significant other with whom the patient has identified as the person who will aid her in the event of a mental health crisis (suicidal ideations/suicide attempt).  With written consent from the patient, the family member/significant other has been provided the following suicide prevention education, prior to the and/or following the discharge of the patient.  The suicide prevention education provided includes the following:  Suicide risk factors  Suicide prevention and interventions  National Suicide Hotline telephone number  [facility] assessment telephone number  [facility] Emergency Assistance 911  [facility] and/or Residential Mobile Crisis Unit telephone number  Request made of family/significant other to:  Remove weapons (e.g., guns, rifles, knives), all items previously/currently identified as safety concern.    Remove drugs/medications (over-the-counter, prescriptions, illicit drugs), all items previously/currently identified as a safety concern.  The family member/significant other verbalizes understanding of the suicide prevention education information provided.  The family member/significant other denies that the items of safety concern listed above are present in the home in which pt resides.  Alease FrameSonya S Exodus Kutzer, LCSW 11/08/2017, 12:41 PM

## 2017-11-08 NOTE — Progress Notes (Signed)
Recreation Therapy Notes  INPATIENT RECREATION THERAPY ASSESSMENT  Patient Details Name: Desiree Sherman MRN: [id number] DOB: September 01, 1995 Today's Date: 11/08/2017       Information Obtained From: Patient  Able to Participate in Assessment/Interview: Yes  Patient Presentation: Responsive  Reason for Admission (Per Patient): Other (Comments), Active Symptoms(Not eating)  Patient Stressors: Other (Comment)(Depression and stress from pregnacy)  Coping Skills:   TV  Leisure Interests (2+):  Social - Friends, Social - Family, Individual - TV(Taking care of daughter)  Frequency of Recreation/Participation: Weekly  Awareness of Community Resources:  Yes  Community Resources:  Library  Current Use: Yes  If no, Barriers?:    Expressed Interest in [employer] CorporationCommunity Resource Information:    [place]  Patient Main Form of Transportation: Other (Comment)(Daughter grandmother takes me around)  Patient Strengths:  N/A  Patient Identified Areas of Improvement:  change my attitude  Patient Goal for Hospitalization:  To get better so I can be discharged  Current SI (including self-harm):  No  Current HI:  No  Current AVH: No  Staff Intervention Plan: Group Attendance, Collaborate with Interdisciplinary Treatment Team  Consent to Intern Participation: N/A  Desiree Sherman 11/08/2017, 3:09 PM

## 2017-11-08 NOTE — Progress Notes (Signed)
Received Desiree Sherman this AM in her room awake, she got up to eat breakfast and lunch. She denied all of the psychiatric symptoms this AM. She has been isolative to her room with the door open, light on and starring out into the nurses station. After dinner she remained OOB in the dining room watching the  TV with her peers.

## 2017-11-08 NOTE — Plan of Care (Signed)
New Admit  Problem: Elimination: Goal: Will not experience complications related to bowel motility Outcome: Not Progressing   Problem: Education: Goal: Knowledge of [facility] Education information/materials will improve Outcome: Not Progressing Goal: Emotional status will improve Outcome: Not Progressing Goal: Mental status will improve Outcome: Not Progressing Goal: Verbalization of understanding the information provided will improve Outcome: Not Progressing   Problem: Activity: Goal: Interest or engagement in activities will improve Outcome: Not Progressing Goal: Sleeping patterns will improve Outcome: Not Progressing   Problem: Coping: Goal: Ability to verbalize frustrations and anger appropriately will improve Outcome: Not Progressing Goal: Ability to demonstrate self-control will improve Outcome: Not Progressing   Problem: Health Behavior/Discharge Planning: Goal: Identification of resources available to assist in meeting health care needs will improve Outcome: Not Progressing Goal: Compliance with treatment plan for underlying cause of condition will improve Outcome: Not Progressing   Problem: Safety: Goal: Periods of time without injury will increase Outcome: Not Progressing   Problem: Education: Goal: Utilization of techniques to improve thought processes will improve Outcome: Not Progressing Goal: Knowledge of the prescribed therapeutic regimen will improve Outcome: Not Progressing   Problem: Coping: Goal: Coping ability will improve Outcome: Not Progressing   Problem: Safety: Goal: Ability to disclose and discuss suicidal ideas will improve Outcome: Not Progressing Goal: Ability to identify and utilize support systems that promote safety will improve Outcome: Not Progressing

## 2017-11-09 LAB — LIPID PANEL
CHOL/HDL RATIO: 3.5 ratio
CHOLESTEROL: 221 mg/dL — AB (ref 0–200)
HDL: 64 mg/dL (ref >40)
LDL CALC: 113 mg/dL — AB (ref 0–99)
Triglycerides: 221 mg/dL — ABNORMAL HIGH (ref <150)
VLDL: 44 mg/dL — AB (ref 0–40)

## 2017-11-09 LAB — HEMOGLOBIN A1C
Hgb A1c MFr Bld: 4.3 % — ABNORMAL LOW (ref 4.8–5.6)
Mean Plasma Glucose: 76.71 mg/dL

## 2017-11-09 LAB — TSH: TSH: 1.3 u[IU]/mL (ref 0.350–4.500)

## 2017-11-09 NOTE — Progress Notes (Signed)
[facility] MD Progress Note  11/09/2017 1:01 PM Desiree Sherman  MRN:  [id number] Subjective:  22 yo patient with depression, also [redacted] weeks pregnant.  She is laying in bed.  She reports improved depression.  Motivation is still low.  She stats she got out of bed yesterday and plans to get out of bed today.  Denies side effects from meds.  Looking forward to going home soon.  Denies SI    Principal Problem: Severe major depression, single episode, without psychotic features (HCC) Diagnosis:   Patient Active Problem List   Diagnosis Date Noted  . Depression [F32.9] 11/08/2017  . Severe major depression, single episode, without psychotic features (HCC) [F32.2] 11/08/2017  . Depression affecting pregnancy, antepartum [O99.340, F32.9] 11/07/2017  . Depression with suicidal ideation [F32.9, R45.851] 11/07/2017   Total Time spent with patient: 20 minutes  Past Psychiatric History: Patient denies past psychiatric history never seen a counselor or therapist never been in a psychiatric hospital.  Never tried to kill herself.  Was prescribed Zoloft but took it only one time before deciding it made her sick to her stomach.   Past Medical History:  Past Medical History:  Diagnosis Date  . Chlamydia    during pregnancy  . Depression     Past Surgical History:  Procedure Laterality Date  . CHEST SURGERY     fell onto glass table, glass removed  . NO PAST SURGERIES     Family History:  Family History  Problem Relation Age of Onset  . Asthma Mother    Family Psychiatric  History: unknown Social History:  Social History   Substance and Sexual Activity  Alcohol Use No     Social History   Substance and Sexual Activity  Drug Use No    Social History   Socioeconomic History  . Marital status: Single    Spouse name: Not on file  . Number of children: Not on file  . Years of education: Not on file  . Highest education level: Not on file  Occupational History  . Not on file  Social  Needs  . Financial resource strain: Not on file  . Food insecurity:    Worry: Not on file    Inability: Not on file  . Transportation needs:    Medical: Not on file    Non-medical: Not on file  Tobacco Use  . Smoking status: Never Smoker  . Smokeless tobacco: Never Used  Substance and Sexual Activity  . Alcohol use: No  . Drug use: No  . Sexual activity: Not Currently  Lifestyle  . Physical activity:    Days per week: Not on file    Minutes per session: Not on file  . Stress: Not on file  Relationships  . Social connections:    Talks on phone: Not on file    Gets together: Not on file    Attends religious service: Not on file    Active member of club or organization: Not on file    Attends meetings of clubs or organizations: Not on file    Relationship status: Not on file  Other Topics Concern  . Not on file  Social History Narrative  . Not on file   Additional Social History:    Pain Medications: denies Prescriptions: Zoloft, Prenatal vitamins Over the Counter: denies History of alcohol / drug use?: No history of alcohol / drug abuse Negative Consequences of Use: (N/A) Withdrawal Symptoms: (N/A)  Sleep: Good  Appetite:  Good  Current Medications: Current Facility-Administered Medications  Medication Dose Route Frequency Provider Last Rate Last Dose  . acetaminophen (TYLENOL) tablet 650 mg  650 mg Oral Q6H PRN Clapacs, John T, MD      . alum & mag hydroxide-simeth (MAALOX/MYLANTA) 200-200-20 MG/5ML suspension 30 mL  30 mL Oral Q4H PRN Clapacs, John T, MD      . feeding supplement (ENSURE ENLIVE) (ENSURE ENLIVE) liquid 237 mL  237 mL Oral BID BM Clapacs, John T, MD      . magnesium hydroxide (MILK OF MAGNESIA) suspension 30 mL  30 mL Oral Daily PRN Clapacs, John T, MD      . prenatal multivitamin tablet 1 tablet  1 tablet Oral Q1200 Clapacs, Jackquline Denmark, MD   1 tablet at 11/09/17 1152  . sertraline (ZOLOFT) tablet 50 mg  50 mg Oral Daily  Clapacs, Jackquline Denmark, MD   50 mg at 11/09/17 1610    Lab Results:  Results for orders placed or performed during the hospital encounter of 11/07/17 (from the past 48 hour(s))  Lipid panel     Status: Abnormal   Collection Time: 11/09/17  6:56 AM  Result Value Ref Range   Cholesterol 221 (H) 0 - 200 mg/dL   Triglycerides 960 (H) <150 mg/dL   HDL 64 >45 mg/dL   Total CHOL/HDL Ratio 3.5 RATIO   VLDL 44 (H) 0 - 40 mg/dL   LDL Cholesterol 409 (H) 0 - 99 mg/dL    Comment:        Total Cholesterol/HDL:CHD Risk Coronary Heart Disease Risk Table                     Men   Women  1/2 Average Risk   3.4   3.3  Average Risk       5.0   4.4  2 X Average Risk   9.6   7.1  3 X Average Risk  23.4   11.0        Use the calculated Patient Ratio above and the CHD Risk Table to determine the patient's CHD Risk.        ATP III CLASSIFICATION (LDL):  <100     mg/dL   Optimal  811-914  mg/dL   Near or Above                    Optimal  130-159  mg/dL   Borderline  782-956  mg/dL   High  >213     mg/dL   Very High Performed at [facility], [address] [place], [place] [address]   TSH     Status: None   Collection Time: 11/09/17  6:56 AM  Result Value Ref Range   TSH 1.300 0.350 - 4.500 uIU/mL    Comment: Performed by a 3rd Generation assay with a functional sensitivity of <=0.01 uIU/mL. Performed at [facility], [address] [place], [place] [address]     Blood Alcohol level:  No results found for: [facility]  Metabolic Disorder Labs: No results found for: HGBA1C, MPG No results found for: PROLACTIN Lab Results  Component Value Date   CHOL 221 (H) 11/09/2017   TRIG 221 (H) 11/09/2017   HDL 64 11/09/2017   CHOLHDL 3.5 11/09/2017   VLDL 44 (H) 11/09/2017   LDLCALC 113 (H) 11/09/2017    Physical Findings: AIMS:  , ,  ,  ,    CIWA:  COWS:     Musculoskeletal: Strength & Muscle Tone: within normal limits Gait & Station: normal Patient leans: N/A  Psychiatric  Specialty Exam: Physical Exam  ROS  Blood pressure 118/62, pulse 81, temperature 98.2 F (36.8 C), temperature source Oral, resp. rate 17, height  (1.676 m), weight 72.8 kg (160 lb 8 oz), SpO2 100 %, unknown if currently breastfeeding.Body mass index is 25.91 kg/m.  General Appearance: Casual  Eye Contact:  Fair  Speech:  Clear and Coherent  Volume:  Normal  Mood:  Dysphoric  Affect:  Congruent  Thought Process:  Coherent  Orientation:  Full (Time, Place, and Person)  Thought Content:  Logical  Suicidal Thoughts:  No  Homicidal Thoughts:  No  Memory:  Immediate;   Good Recent;   Good Remote;   Good  Judgement:  Poor  Insight:  Lacking  Psychomotor Activity:  NA  Concentration:  Concentration: Fair and Attention Span: Fair  Recall:  Good  Fund of Knowledge:  Fair  Language:  Good  Akathisia:  NA  Handed:  Right  AIMS (if indicated):     Assets:  Communication Skills Housing Social Support  ADL's:  Intact  Cognition:  WNL  Sleep:  Number of Hours: 6     Treatment Plan Summary: Daily contact with patient to assess and evaluate symptoms and progress in treatment   22 year old woman with several weeks of depressed mood. Minimizes symptoms on interviewes.  Physician Treatment Plan for Primary Diagnosis: Severe major depression, single episode, without psychotic features (HCC) Long Term Goal(s): Improvement in symptoms so as ready for discharge  Short Term Goals: Ability to identify changes in lifestyle to reduce recurrence of condition will improve and Ability to verbalize feelings will improve   -continue sertraline  -her cholesterol panel is elevated. Defer to outpt management  -pending a1c, GC/chlamydia  I certify that inpatient services furnished can reasonably be expected to improve the patient's condition.       Cindee Lame, MD 11/09/2017, 1:01 PM

## 2017-11-09 NOTE — Progress Notes (Signed)
Received Desiree Sherman this AM after breakfast, she was compliant with her dose of Zoloft and stated she has taken it before. She denied all of the psychiatric symptoms this morning. She has been OOB on the milieu at intervals. Her affect is brighter this morning.

## 2017-11-09 NOTE — Plan of Care (Signed)
  Problem: Elimination: Goal: Will not experience complications related to bowel motility Outcome: Progressing   Problem: Education: Goal: Knowledge of [place] Education information/materials will improve Outcome: Progressing Goal: Emotional status will improve Outcome: Progressing Goal: Mental status will improve Outcome: Progressing Goal: Verbalization of understanding the information provided will improve Outcome: Progressing   Problem: Activity: Goal: Interest or engagement in activities will improve Outcome: Progressing Goal: Sleeping patterns will improve Outcome: Progressing   Problem: Coping: Goal: Ability to verbalize frustrations and anger appropriately will improve Outcome: Progressing Goal: Ability to demonstrate self-control will improve Outcome: Progressing   Problem: Health Behavior/Discharge Planning: Goal: Identification of resources available to assist in meeting health care needs will improve Outcome: Progressing Goal: Compliance with treatment plan for underlying cause of condition will improve Outcome: Progressing   Problem: Safety: Goal: Periods of time without injury will increase Outcome: Progressing   Problem: Education: Goal: Utilization of techniques to improve thought processes will improve Outcome: Progressing Goal: Knowledge of the prescribed therapeutic regimen will improve Outcome: Progressing   Problem: Coping: Goal: Coping ability will improve Outcome: Progressing   Problem: Safety: Goal: Ability to disclose and discuss suicidal ideas will improve Outcome: Progressing Goal: Ability to identify and utilize support systems that promote safety will improve Outcome: Progressing

## 2017-11-09 NOTE — Progress Notes (Signed)
Pt has been isolative to room. Pt took her zoloft medication without incident. Pt is calm and pleasant. Pt denies si/hi/ah/vh and pain at this time. No issues noted/reported. Will cont to monitor pt.

## 2017-11-09 NOTE — [facility] Group Notes (Signed)
LCSW Group Therapy Note  11/09/2017 1:15pm  Type of Therapy and Topic:  Group Therapy:  Cognitive Distortions  Participation Level:  Active   Description of Group:    Patients in this group will be introduced to the topic of cognitive distortions.  Patients will identify and describe cognitive distortions, describe the feelings these distortions create for them.  Patients will identify one or more situations in their personal life where they have cognitively distorted thinking and will verbalize challenging this cognitive distortion through positive thinking skills.  Patients will practice the skill of using positive affirmations to challenge cognitive distortions using affirmation cards.    Therapeutic Goals:  1. Patient will identify two or more cognitive distortions they have used 2. Patient will identify one or more emotions that stem from use of a cognitive distortion 3. Patient will demonstrate use of a positive affirmation to counter a cognitive distortion through discussion and/or role play. 4. Patient will describe one way cognitive distortions can be detrimental to wellness   Summary of Patient Progress: The patient reported she feels "good today." Patients were introduced to the topic of cognitive distortions.  Patient was able to identify and describe cognitive distortions. She described the feelings these distortions create for her.  Patient identified situations in her personal life where she has cognitively distorted thinking and verbalized challenging these cognitive distortions through positive thinking skills.  Patients practiced the skill of using positive affirmations to challenge cognitive distortions using affirmation cards. Patient actively engaged in group discussion.        Therapeutic Modalities:   Cognitive Behavioral Therapy Motivational Interviewing   Reuel Lamadrid  CUEBAS-COLON, LCSW 11/09/2017 11:35 AM

## 2017-11-10 NOTE — Progress Notes (Signed)
Received Desiree Sherman this AM after breakfast, she was compliant with her medications. She denied all of the psychiatric symptoms and stated she feels much better. We talked one to one about depression, hormones and pregnancy. We talked about the stigma of depression. 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 She has been OOB in the milieu all morning with her peers and her affect is brighter today. No change in her status this PM.

## 2017-11-10 NOTE — Plan of Care (Signed)
Pt is 30(+) weeks pregnant. No complaints of fetal not moving or pressure in lower abdominal. Pt is pleasant and calm upon approach. Pt verbalized she wants to go to school to better herself but start off at CNA school. Pt express concerns of how to go about to getting housing and better choice when it comes to sexual activity. Pt remains on Q 15 mins safety rounds will cont to monitor pt.  Problem: Elimination: Goal: Will not experience complications related to bowel motility Outcome: Progressing   Problem: Education: Goal: Knowledge of [place] Education information/materials will improve Outcome: Progressing Goal: Emotional status will improve Outcome: Progressing Goal: Mental status will improve Outcome: Progressing Goal: Verbalization of understanding the information provided will improve Outcome: Progressing   Problem: Activity: Goal: Interest or engagement in activities will improve Outcome: Progressing Goal: Sleeping patterns will improve Outcome: Progressing   Problem: Coping: Goal: Ability to verbalize frustrations and anger appropriately will improve Outcome: Progressing Goal: Ability to demonstrate self-control will improve Outcome: Progressing   Problem: Health Behavior/Discharge Planning: Goal: Identification of resources available to assist in meeting health care needs will improve Outcome: Progressing Goal: Compliance with treatment plan for underlying cause of condition will improve Outcome: Progressing   Problem: Safety: Goal: Periods of time without injury will increase Outcome: Progressing   Problem: Education: Goal: Utilization of techniques to improve thought processes will improve Outcome: Progressing Goal: Knowledge of the prescribed therapeutic regimen will improve Outcome: Progressing   Problem: Coping: Goal: Coping ability will improve Outcome: Progressing   Problem: Safety: Goal: Ability to disclose and discuss suicidal ideas will  improve Outcome: Progressing Goal: Ability to identify and utilize support systems that promote safety will improve Outcome: Progressing

## 2017-11-10 NOTE — Progress Notes (Signed)
[facility] MD Progress Note  11/10/2017 1:55 PM Desiree Sherman  MRN:  [id number] Subjective:  22 yo patient with depression, also [redacted] weeks pregnant.  In bed this morning.  States she is feeling better than yesterday.  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  She feels more motivated now.  Looking forward to going home, has not talked to her family yet.  Dneies SI .  Later she got up for snack and watched some tv in the day room. Denies med rx    Principal Problem: Severe major depression, single episode, without psychotic features (HCC) Diagnosis:   Patient Active Problem List   Diagnosis Date Noted  . Depression [F32.9] 11/08/2017  . Severe major depression, single episode, without psychotic features (HCC) [F32.2] 11/08/2017  . Depression affecting pregnancy, antepartum [O99.340, F32.9] 11/07/2017  . Depression with suicidal ideation [F32.9, R45.851] 11/07/2017   Total Time spent with patient: 20 minutes  Past Psychiatric History: Patient denies past psychiatric history never seen a counselor or therapist never been in a psychiatric hospital.  Never tried to kill herself.  Was prescribed Zoloft but took it only one time before deciding it made her sick to her stomach.   Past Medical History:  Past Medical History:  Diagnosis Date  . Chlamydia    during pregnancy  . Depression     Past Surgical History:  Procedure Laterality Date  . CHEST SURGERY     fell onto glass table, glass removed  . NO PAST SURGERIES     Family History:  Family History  Problem Relation Age of Onset  . Asthma Mother    Family Psychiatric  History: unknown Social History:  Social History   Substance and Sexual Activity  Alcohol Use No     Social History   Substance and Sexual Activity  Drug Use No    Social History   Socioeconomic History  . Marital status: Single    Spouse name: Not on file  . Number of children: Not on  file  . Years of education: Not on file  . Highest education level: Not on file  Occupational History  . Not on file  Social Needs  . Financial resource strain: Not on file  . Food insecurity:    Worry: Not on file    Inability: Not on file  . Transportation needs:    Medical: Not on file    Non-medical: Not on file  Tobacco Use  . Smoking status: Never Smoker  . Smokeless tobacco: Never Used  Substance and Sexual Activity  . Alcohol use: No  . Drug use: No  . Sexual activity: Not Currently  Lifestyle  . Physical activity:    Days per week: Not on file    Minutes per session: Not on file  . Stress: Not on file  Relationships  . Social connections:    Talks on phone: Not on file    Gets together: Not on file    Attends religious service: Not on file    Active member of club or organization: Not on file    Attends meetings of clubs or organizations: Not on file    Relationship status: Not on file  Other Topics Concern  . Not on file  Social History Narrative  . Not on file   Additional Social History:    Pain Medications: denies Prescriptions: Zoloft, Prenatal vitamins Over the Counter: denies History of alcohol /  drug use?: No history of alcohol / drug abuse Negative Consequences of Use: (N/A) Withdrawal Symptoms: (N/A)                    Sleep: Good  Appetite:  Good  Current Medications: Current Facility-Administered Medications  Medication Dose Route Frequency Provider Last Rate Last Dose  . acetaminophen (TYLENOL) tablet 650 mg  650 mg Oral Q6H PRN Clapacs, John T, MD      . alum & mag hydroxide-simeth (MAALOX/MYLANTA) 200-200-20 MG/5ML suspension 30 mL  30 mL Oral Q4H PRN Clapacs, John T, MD      . feeding supplement (ENSURE ENLIVE) (ENSURE ENLIVE) liquid 237 mL  237 mL Oral BID BM Clapacs, John T, MD   237 mL at 11/10/17 1030  . magnesium hydroxide (MILK OF MAGNESIA) suspension 30 mL  30 mL Oral Daily PRN Clapacs, John T, MD      . prenatal  multivitamin tablet 1 tablet  1 tablet Oral Q1200 Clapacs, Jackquline Denmark, MD   1 tablet at 11/10/17 1148  . sertraline (ZOLOFT) tablet 50 mg  50 mg Oral Daily Clapacs, Jackquline Denmark, MD   50 mg at 11/10/17 1610    Lab Results:  Results for orders placed or performed during the hospital encounter of 11/07/17 (from the past 48 hour(s))  Hemoglobin A1c     Status: Abnormal   Collection Time: 11/09/17  6:56 AM  Result Value Ref Range   Hgb A1c MFr Bld 4.3 (L) 4.8 - 5.6 %    Comment: (NOTE) Pre diabetes:          5.7%-6.4% Diabetes:              >6.4% Glycemic control for   <7.0% adults with diabetes    Mean Plasma Glucose 76.71 mg/dL    Comment: Performed at [facility], [address], [place], [place] [address]  Lipid panel     Status: Abnormal   Collection Time: 11/09/17  6:56 AM  Result Value Ref Range   Cholesterol 221 (H) 0 - 200 mg/dL   Triglycerides 409 (H) <150 mg/dL   HDL 64 >81 mg/dL   Total CHOL/HDL Ratio 3.5 RATIO   VLDL 44 (H) 0 - 40 mg/dL   LDL Cholesterol 191 (H) 0 - 99 mg/dL    Comment:        Total Cholesterol/HDL:CHD Risk Coronary Heart Disease Risk Table                     Men   Women  1/2 Average Risk   3.4   3.3  Average Risk       5.0   4.4  2 X Average Risk   9.6   7.1  3 X Average Risk  23.4   11.0        Use the calculated Patient Ratio above and the CHD Risk Table to determine the patient's CHD Risk.        ATP III CLASSIFICATION (LDL):  <100     mg/dL   Optimal  478-295  mg/dL   Near or Above                    Optimal  130-159  mg/dL   Borderline  621-308  mg/dL   High  >657     mg/dL   Very High Performed at [facility], [address], [place], [place] [address]   TSH  Status: None   Collection Time: 11/09/17  6:56 AM  Result Value Ref Range   TSH 1.300 0.350 - 4.500 uIU/mL    Comment: Performed by a 3rd Generation assay with a functional sensitivity of <=0.01 uIU/mL. Performed at [facility], [address]  [place], [place] [address]     Blood Alcohol level:  No results found for: [facility]  Metabolic Disorder Labs: Lab Results  Component Value Date   HGBA1C 4.3 (L) 11/09/2017   MPG 76.71 11/09/2017   No results found for: PROLACTIN Lab Results  Component Value Date   CHOL 221 (H) 11/09/2017   TRIG 221 (H) 11/09/2017   HDL 64 11/09/2017   CHOLHDL 3.5 11/09/2017   VLDL 44 (H) 11/09/2017   LDLCALC 113 (H) 11/09/2017    Physical Findings: AIMS:  , ,  ,  ,    CIWA:    COWS:     Musculoskeletal: Strength & Muscle Tone: within normal limits Gait & Station: normal Patient leans: N/A  Psychiatric Specialty Exam: Physical Exam   ROS   Blood pressure 118/62, pulse 81, temperature 98.2 F (36.8 C), temperature source Oral, resp. rate 17, height  (1.676 m), weight 72.8 kg (160 lb 8 oz), SpO2 100 %, unknown if currently breastfeeding.Body mass index is 25.91 kg/m.  General Appearance: Casual  Eye Contact:  Fair  Speech:  Clear and Coherent  Volume:  Normal  Mood:  Dysphoric  Affect:  Congruent  Thought Process:  Coherent  Orientation:  Full (Time, Place, and Person)  Thought Content:  Logical  Suicidal Thoughts:  No  Homicidal Thoughts:  No  Memory:  Immediate;   Good Recent;   Good Remote;   Good  Judgement:  Poor  Insight:  Lacking  Psychomotor Activity:  NA  Concentration:  Concentration: Fair and Attention Span: Fair  Recall:  Good  Fund of Knowledge:  Fair  Language:  Good  Akathisia:  NA  Handed:  Right  AIMS (if indicated):     Assets:  Communication Skills Housing Social Support  ADL's:  Intact  Cognition:  WNL  Sleep:  Number of Hours: 7.3     Treatment Plan Summary: Daily contact with patient to assess and evaluate symptoms and progress in treatment   22 year old woman with several weeks of depressed mood. Minimizes symptoms on interviewes.  Physician Treatment Plan for Primary Diagnosis: Severe major depression, single episode, without psychotic  features (HCC) Long Term Goal(s): Improvement in symptoms so as ready for discharge  Short Term Goals: Ability to identify changes in lifestyle to reduce recurrence of condition will improve and Ability to verbalize feelings will improve   -continue sertraline  (increased on 4/26 from  which was home dose) -her cholesterol panel is elevated. Defer to outpt management  -a1c WNL -pending GC/chlamydia  I certify that inpatient services furnished can reasonably be expected to improve the patient's condition.       Cindee Lame, MD 11/10/2017, 1:55 PM

## 2017-11-10 NOTE — Plan of Care (Signed)
Pt has been in bed since the beginning of shift. Pt verbalized she had been up and attending group today. Pt stated, "she is feeling better than when she came in". Pt denies ah/vh/si/hi/pain at this time. Will cont to monitor pt.  Problem: Elimination: Goal: Will not experience complications related to bowel motility Outcome: Progressing   Problem: Education: Goal: Knowledge of [place] [facility] Education information/materials will improve Outcome: Progressing Goal: Emotional status will improve Outcome: Progressing Goal: Mental status will improve Outcome: Progressing Goal: Verbalization of understanding the information provided will improve Outcome: Progressing   Problem: Activity: Goal: Interest or engagement in activities will improve Outcome: Progressing Goal: Sleeping patterns will improve Outcome: Progressing   Problem: Coping: Goal: Ability to verbalize frustrations and anger appropriately will improve Outcome: Progressing Goal: Ability to demonstrate self-control will improve Outcome: Progressing   Problem: Health Behavior/Discharge Planning: Goal: Identification of resources available to assist in meeting health care needs will improve Outcome: Progressing Goal: Compliance with treatment plan for underlying cause of condition will improve Outcome: Progressing   Problem: Safety: Goal: Periods of time without injury will increase Outcome: Progressing   Problem: Education: Goal: Utilization of techniques to improve thought processes will improve Outcome: Progressing Goal: Knowledge of the prescribed therapeutic regimen will improve Outcome: Progressing   Problem: Coping: Goal: Coping ability will improve Outcome: Progressing   Problem: Safety: Goal: Ability to disclose and discuss suicidal ideas will improve Outcome: Progressing Goal: Ability to identify and utilize support systems that promote safety will improve Outcome: Progressing

## 2017-11-10 NOTE — BHH Group Notes (Signed)
LCSW Group Therapy Note 11/10/2017 1:15pm  Type of Therapy and Topic: Group Therapy: Feelings Around Returning Home & Establishing a Supportive Framework and Supporting Oneself When Supports Not Available  Participation Level: Active  Description of Group:  Patients first processed thoughts and feelings about upcoming discharge. These included fears of upcoming changes, lack of change, new living environments, judgements and expectations from others and overall stigma of mental health issues. The group then discussed the definition of a supportive framework, what that looks and feels like, and how do to discern it from an unhealthy non-supportive network. The group identified different types of supports as well as what to do when your family/friends are less than helpful or unavailable  Therapeutic Goals  1. Patient will identify one healthy supportive network that they can use at discharge. 2. Patient will identify one factor of a supportive framework and how to tell it from an unhealthy network. 3. Patient able to identify one coping skill to use when they do not have positive supports from others. 4. Patient will demonstrate ability to communicate their needs through discussion and/or role plays.  Summary of Patient Progress:  The patient reported she feels "good." Pt engaged during group session. As patients processed their anxiety about discharge and described healthy supports patient shared she is ready to go home.  Patients identified at least one self-care tool they were willing to use after discharge; listen to music.   Therapeutic Modalities Cognitive Behavioral Therapy Motivational Interviewing   Desiree Sherman  CUEBAS-COLON, LCSW 11/10/2017 12:08 PM

## 2017-11-11 NOTE — Progress Notes (Signed)
Recreation Therapy Notes  Date: 11/11/2017  Time: 9:30 am   Location: Craft Room   Behavioral response: N/A   Intervention Topic: Coping skills  Discussion/Intervention: Patient did not attend group.   Clinical Observations/Feedback:  Patient did not attend group.   Laquitha Heslin LRT/CTRS        Tricia Pledger 11/11/2017 11:36 AM 

## 2017-11-11 NOTE — Plan of Care (Signed)
Patient is alert and oriented this morning, denies feelings of SI, HI and AVH. Patient rates her depression this morning 0/10, anxiety 0/10. Patient was able to eat breakfast this morning, and rates pain 0/10. Patient attended and participated in groups this morning. 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 Patient's affect is pleasant and compliant with staff and medications. Nurse will continue to monitor, safety checks will continue Q 15 minutes. Problem: Elimination: Goal: Will not experience complications related to bowel motility Outcome: Progressing   Problem: Education: Goal: Knowledge of [place] [facility] Education information/materials will improve Outcome: Progressing Goal: Emotional status will improve Outcome: Progressing Goal: Mental status will improve Outcome: Progressing Goal: Verbalization of understanding the information provided will improve Outcome: Progressing   Problem: Activity: Goal: Interest or engagement in activities will improve Outcome: Progressing Goal: Sleeping patterns will improve Outcome: Progressing

## 2017-11-11 NOTE — Progress Notes (Signed)
[facility] MD Progress Note  11/11/2017 6:44 PM Desiree Sherman  MRN:  [id number] Subjective: Follow-up for 22 year old woman who is pregnant and has depression.  Patient says she is feeling better.  She has been eating normally over the weekend.  Denies suicidal thoughts.  Affect looks a little brighter.  She is taking care of herself well.  Shows reasonably good insight.  No acute dangerousness. Principal Problem: Severe major depression, single episode, without psychotic features (HCC) Diagnosis:   Patient Active Problem List   Diagnosis Date Noted  . Depression [F32.9] 11/08/2017  . Severe major depression, single episode, without psychotic features (HCC) [F32.2] 11/08/2017  . Depression affecting pregnancy, antepartum [O99.340, F32.9] 11/07/2017  . Depression with suicidal ideation [F32.9, R45.851] 11/07/2017   Total Time spent with patient: 20 minutes  Past Psychiatric History: Past history of some depression but this episode significantly worse  Past Medical History:  Past Medical History:  Diagnosis Date  . Chlamydia    during pregnancy  . Depression     Past Surgical History:  Procedure Laterality Date  . CHEST SURGERY     fell onto glass table, glass removed  . NO PAST SURGERIES     Family History:  Family History  Problem Relation Age of Onset  . Asthma Mother    Family Psychiatric  History: None Social History:  Social History   Substance and Sexual Activity  Alcohol Use No     Social History   Substance and Sexual Activity  Drug Use No    Social History   Socioeconomic History  . Marital status: Single    Spouse name: Not on file  . Number of children: Not on file  . Years of education: Not on file  . Highest education level: Not on file  Occupational History  . Not on file  Social Needs  . Financial resource strain: Not on file  . Food insecurity:    Worry: Not on file    Inability: Not on file  . Transportation needs:    Medical: Not on  file    Non-medical: Not on file  Tobacco Use  . Smoking status: Never Smoker  . Smokeless tobacco: Never Used  Substance and Sexual Activity  . Alcohol use: No  . Drug use: No  . Sexual activity: Not Currently  Lifestyle  . Physical activity:    Days per week: Not on file    Minutes per session: Not on file  . Stress: Not on file  Relationships  . Social connections:    Talks on phone: Not on file    Gets together: Not on file    Attends religious service: Not on file    Active member of club or organization: Not on file    Attends meetings of clubs or organizations: Not on file    Relationship status: Not on file  Other Topics Concern  . Not on file  Social History Narrative  . Not on file   Additional Social History:    Pain Medications: denies Prescriptions: Zoloft, Prenatal vitamins Over the Counter: denies History of alcohol / drug use?: No history of alcohol / drug abuse Negative Consequences of Use: (N/A) Withdrawal Symptoms: (N/A)                    Sleep: Fair  Appetite:  Fair  Current Medications: Current Facility-Administered Medications  Medication Dose Route Frequency Provider Last Rate Last Dose  . acetaminophen (TYLENOL) tablet 650 mg  650 mg Oral Q6H PRN Idali Lafever T, MD      . alum & mag hydroxide-simeth (MAALOX/MYLANTA) 200-200-20 MG/5ML suspension 30 mL  30 mL Oral Q4H PRN Jaremy Nosal T, MD      . feeding supplement (ENSURE ENLIVE) (ENSURE ENLIVE) liquid 237 mL  237 mL Oral BID BM Antawn Sison T, MD   237 mL at 11/11/17 1401  . magnesium hydroxide (MILK OF MAGNESIA) suspension 30 mL  30 mL Oral Daily PRN Alvine Mostafa T, MD      . prenatal multivitamin tablet 1 tablet  1 tablet Oral Q1200 Lizmary Nader, Jackquline Denmark, MD   1 tablet at 11/11/17 1222  . sertraline (ZOLOFT) tablet 50 mg  50 mg Oral Daily Maynard David, Jackquline Denmark, MD   50 mg at 11/11/17 0845    Lab Results: No results found for this or any previous visit (from the past 48 hour(s)).  Blood  Alcohol level:  No results found for: [facility]  Metabolic Disorder Labs: Lab Results  Component Value Date   HGBA1C 4.3 (L) 11/09/2017   MPG 76.71 11/09/2017   No results found for: PROLACTIN Lab Results  Component Value Date   CHOL 221 (H) 11/09/2017   TRIG 221 (H) 11/09/2017   HDL 64 11/09/2017   CHOLHDL 3.5 11/09/2017   VLDL 44 (H) 11/09/2017   LDLCALC 113 (H) 11/09/2017    Physical Findings: AIMS:  , ,  ,  ,    CIWA:    COWS:     Musculoskeletal: Strength & Muscle Tone: within normal limits Gait & Station: normal Patient leans: N/A  Psychiatric Specialty Exam: Physical Exam  Nursing note and vitals reviewed. Constitutional: She appears well-developed and well-nourished.  HENT:  Head: Normocephalic and atraumatic.  Eyes: Pupils are equal, round, and reactive to light. Conjunctivae are normal.  Neck: Normal range of motion.  Cardiovascular: Regular rhythm and normal heart sounds.  Respiratory: Effort normal. No respiratory distress.  GI: Soft.  Musculoskeletal: Normal range of motion.  Neurological: She is alert.  Skin: Skin is warm and dry.  Psychiatric: She has a normal mood and affect. Her speech is normal and behavior is normal. Judgment and thought content normal. Cognition and memory are normal.    Review of Systems  Constitutional: Negative.   HENT: Negative.   Eyes: Negative.   Respiratory: Negative.   Cardiovascular: Negative.   Gastrointestinal: Negative.   Musculoskeletal: Negative.   Skin: Negative.   Neurological: Negative.   Psychiatric/Behavioral: Negative.     Blood pressure 100/65, pulse 88, temperature 97.8 F (36.6 C), temperature source Oral, resp. rate 18, height  (1.676 m), weight 72.8 kg (160 lb 8 oz), SpO2 100 %, unknown if currently breastfeeding.Body mass index is 25.91 kg/m.  General Appearance: Casual  Eye Contact:  Good  Speech:  Slow  Volume:  Decreased  Mood:  Euthymic  Affect:  Constricted  Thought Process:  Coherent   Orientation:  Full (Time, Place, and Person)  Thought Content:  Logical  Suicidal Thoughts:  No  Homicidal Thoughts:  No  Memory:  Immediate;   Fair Recent;   Fair Remote;   Fair  Judgement:  Fair  Insight:  Fair  Psychomotor Activity:  Decreased  Concentration:  Concentration: Fair  Recall:  [employer] of Knowledge:  Fair  Language:  Fair  Akathisia:  No  Handed:  Right  AIMS (if indicated):     Assets:  Desire for Improvement Housing Physical Health Resilience  ADL's:  Intact  Cognition:  WNL  Sleep:  Number of Hours: 7     Treatment Plan Summary: Plan 22 year old woman with depression and pregnancy who has been calm and cooperative over the weekend.  No new complaints.  No indication of acute dangerousness.  Eating better than she was before.  Spent some time doing psychoeducation and encouragement with her.  Made sure that she has outpatient treatment in [place].  We will be looking forward to most likely discharging her by tomorrow.  Mordecai Rasmussen, MD 11/11/2017, 6:44 PM

## 2017-11-12 MED ORDER — PRENATAL MULTIVITAMIN CH: 1.0000 | ORAL_TABLET | Freq: Every day | ORAL | 1 refills | Status: DC

## 2017-11-12 MED ORDER — SERTRALINE HCL 50 MG PO TABS: 50.0000 mg | ORAL_TABLET | Freq: Every day | ORAL | 1 refills | Status: DC

## 2017-11-12 NOTE — Plan of Care (Signed)
Patient slept for Estimated Hours of 6.0; Precautionary checks every 15 minutes for safety maintained, room free of safety hazards, patient sustains no injury or falls during this shift.  Problem: Elimination: Goal: Will not experience complications related to bowel motility Outcome: Progressing   Problem: Education: Goal: Knowledge of [place] [facility] Education information/materials will improve Outcome: Progressing Goal: Emotional status will improve Outcome: Progressing Goal: Mental status will improve Outcome: Progressing Goal: Verbalization of understanding the information provided will improve Outcome: Progressing   Problem: Activity: Goal: Interest or engagement in activities will improve Outcome: Progressing Goal: Sleeping patterns will improve Outcome: Progressing   Problem: Coping: Goal: Ability to verbalize frustrations and anger appropriately will improve Outcome: Progressing Goal: Ability to demonstrate self-control will improve Outcome: Progressing   Problem: Health Behavior/Discharge Planning: Goal: Identification of resources available to assist in meeting health care needs will improve Outcome: Progressing Goal: Compliance with treatment plan for underlying cause of condition will improve Outcome: Progressing   Problem: Safety: Goal: Periods of time without injury will increase Outcome: Progressing   Problem: Education: Goal: Utilization of techniques to improve thought processes will improve Outcome: Progressing Goal: Knowledge of the prescribed therapeutic regimen will improve Outcome: Progressing   Problem: Coping: Goal: Coping ability will improve Outcome: Progressing   Problem: Safety: Goal: Ability to disclose and discuss suicidal ideas will improve Outcome: Progressing Goal: Ability to identify and utilize support systems that promote safety will improve Outcome: Progressing

## 2017-11-12 NOTE — BHH Group Notes (Signed)
CSW Group Therapy Note  11/12/2017  Time:  0900  Type of Therapy and Topic: Group Therapy: Goals Group: SMART Goals    Participation Level:  Active    Description of Group:   The purpose of a daily goals group is to assist and guide patients in setting recovery/wellness-related goals. The objective is to set goals as they relate to the crisis in which they were admitted. Patients will be using SMART goal modalities to set measurable goals. Characteristics of realistic goals will be discussed and patients will be assisted in setting and processing how one will reach their goal. Facilitator will also assist patients in applying interventions and coping skills learned in psycho-education groups to the SMART goal and process how one will achieve defined goal.    Therapeutic Goals:  -Patients will develop and document one goal related to or their crisis in which brought them into treatment.  -Patients will be guided by LCSW using SMART goal setting modality in how to set a measurable, attainable, realistic and time sensitive goal.  -Patients will process barriers in reaching goal.  -Patients will process interventions in how to overcome and successful in reaching goal.    Patient's Goal:  Pt continues to work towards their tx goals but has not yet reached them. Pt was able to appropriately participate in group discussion, and was able to offer support/validation to other group members. Pt stated she is feeling, "good because I'm feeling better." Pt reported her goal is to "go home by speaking with my doctor by the end of the day."    Therapeutic Modalities:  Motivational Interviewing  Cognitive Behavioral Therapy  Crisis Intervention Model  SMART goals setting  Heidi Dach, MSW, LCSW Clinical Social Worker 11/12/2017 9:35 AM

## 2017-11-12 NOTE — Discharge Summary (Signed)
Physician Discharge Summary Note  Patient:  Desiree Sherman is an 22 y.o., female MRN:  [id number] DOB:  [birth year] Patient phone:  [phone] (home)  Patient address:   [address] [place] [address],  Total Time spent with patient: 45 minutes  Date of Admission:  11/07/2017 Date of Discharge: November 12, 2017  Reason for Admission: Patient was admitted through the emergency room because of severe depression.  Referred from [place].  Had not been eating for a couple days.  Possibly passive suicidal ideation.  Principal Problem: Severe major depression, single episode, without psychotic features [facility]) Discharge Diagnoses: Patient Active Problem List   Diagnosis Date Noted  . Depression [F32.9] 11/08/2017  . Severe major depression, single episode, without psychotic features (HCC) [F32.2] 11/08/2017  . Depression affecting pregnancy, antepartum [O99.340, F32.9] 11/07/2017  . Depression with suicidal ideation [F32.9, R45.851] 11/07/2017    Past Psychiatric History: Patient has a history of depression diagnosed several months ago noncompliance with outpatient treatment no previous hospitalization no history of suicide attempts  Past Medical History:  Past Medical History:  Diagnosis Date  . Chlamydia    during pregnancy  . Depression     Past Surgical History:  Procedure Laterality Date  . CHEST SURGERY     fell onto glass table, glass removed  . NO PAST SURGERIES     Family History:  Family History  Problem Relation Age of Onset  . Asthma Mother    Family Psychiatric  History: None Social History:  Social History   Substance and Sexual Activity  Alcohol Use No     Social History   Substance and Sexual Activity  Drug Use No    Social History   Socioeconomic History  . Marital status: Single    Spouse name: Not on file  . Number of children: Not on file  . Years of education: Not on file  . Highest education level: Not on file   Occupational History  . Not on file  Social Needs  . Financial resource strain: Not on file  . Food insecurity:    Worry: Not on file    Inability: Not on file  . Transportation needs:    Medical: Not on file    Non-medical: Not on file  Tobacco Use  . Smoking status: Never Smoker  . Smokeless tobacco: Never Used  Substance and Sexual Activity  . Alcohol use: No  . Drug use: No  . Sexual activity: Not Currently  Lifestyle  . Physical activity:    Days per week: Not on file    Minutes per session: Not on file  . Stress: Not on file  Relationships  . Social connections:    Talks on phone: Not on file    Gets together: Not on file    Attends religious service: Not on file    Active member of club or organization: Not on file    Attends meetings of clubs or organizations: Not on file    Relationship status: Not on file  Other Topics Concern  . Not on file  Social History Narrative  . Not on file    Hospital Course: Patient has been stabilized in the psychiatric unit.  Has not displayed any dangerous behavior.  Denies suicidal ideation.  Prescribed Zoloft 50 mg a day which has been tolerated without difficulty.  Patient is eating normally taking prenatal vitamins.  Affect slightly improved mood stated as better denies suicidal thought no evidence of psychosis agrees  to outpatient treatment.  Physical Findings: AIMS:  , ,  ,  ,    CIWA:    COWS:     Musculoskeletal: Strength & Muscle Tone: within normal limits Gait & Station: normal Patient leans: N/A  Psychiatric Specialty Exam: Physical Exam  Nursing note and vitals reviewed. Constitutional: She appears well-developed and well-nourished.  HENT:  Head: Normocephalic and atraumatic.  Eyes: Pupils are equal, round, and reactive to light. Conjunctivae are normal.  Neck: Normal range of motion.  Cardiovascular: Regular rhythm and normal heart sounds.  Respiratory: Effort normal. No respiratory distress.  GI: Soft.   Musculoskeletal: Normal range of motion.  Neurological: She is alert.  Skin: Skin is warm and dry.  Psychiatric: She has a normal mood and affect. Her speech is normal and behavior is normal. Judgment and thought content normal. Cognition and memory are normal.    Review of Systems  Constitutional: Negative.   HENT: Negative.   Eyes: Negative.   Respiratory: Negative.   Cardiovascular: Negative.   Gastrointestinal: Negative.   Musculoskeletal: Negative.   Skin: Negative.   Neurological: Negative.   Psychiatric/Behavioral: Negative.     Blood pressure (!) 106/57, pulse 86, temperature (!) 97.5 F (36.4 C), temperature source Oral, resp. rate 18, height  (1.676 m), weight 72.8 kg (160 lb 8 oz), SpO2 100 %, unknown if currently breastfeeding.Body mass index is 25.91 kg/m.  General Appearance: Casual  Eye Contact:  Good  Speech:  Clear and Coherent  Volume:  Normal  Mood:  Euthymic  Affect:  Congruent  Thought Process:  Goal Directed  Orientation:  Full (Time, Place, and Person)  Thought Content:  Logical  Suicidal Thoughts:  No  Homicidal Thoughts:  No  Memory:  Immediate;   Fair Recent;   Fair Remote;   Fair  Judgement:  Fair  Insight:  Fair  Psychomotor Activity:  Decreased  Concentration:  Concentration: Fair  Recall:  Fair  Fund of Knowledge:  Fair  Language:  Fair  Akathisia:  No  Handed:  Right  AIMS (if indicated):     Assets:  Desire for Improvement Housing Physical Health Resilience  ADL's:  Intact  Cognition:  WNL  Sleep:  Number of Hours: 6        Has this patient used any form of tobacco in the last 30 days? (Cigarettes, Smokeless Tobacco, Cigars, and/or Pipes) Yes, No  Blood Alcohol level:  No results found for: [facility]  Metabolic Disorder Labs:  Lab Results  Component Value Date   HGBA1C 4.3 (L) 11/09/2017   MPG 76.71 11/09/2017   No results found for: PROLACTIN Lab Results  Component Value Date   CHOL 221 (H) 11/09/2017   TRIG 221  (H) 11/09/2017   HDL 64 11/09/2017   CHOLHDL 3.5 11/09/2017   VLDL 44 (H) 11/09/2017   LDLCALC 113 (H) 11/09/2017    See Psychiatric Specialty Exam and Suicide Risk Assessment completed by Attending Physician prior to discharge.  Discharge destination:  Home  Is patient on multiple antipsychotic therapies at discharge:  No   Has Patient had three or more failed trials of antipsychotic monotherapy by history:  No  Recommended Plan for Multiple Antipsychotic Therapies: NA  Discharge Instructions    Diet - low sodium heart healthy   Complete by:  As directed    Increase activity slowly   Complete by:  As directed      Allergies as of 11/12/2017   No Known Allergies  Medication List    TAKE these medications     Indication  prenatal multivitamin Tabs tablet Take 1 tablet by mouth daily at 12 noon.  Indication:  Pregnancy   sertraline 50 MG tablet Commonly known as:  ZOLOFT Take 1 tablet (50 mg total) by mouth daily. Start taking on:  11/13/2017 What changed:    medication strength  how much to take  Indication:  Major Depressive Disorder      Follow-up Information    [facility]. Go on 11/13/2017.   Why:  Please go to your hospital follow up appointment on Wednesday, 11/13/17 at 8:30AM. Thank you! Contact information: [address] [place] [address] [phone]           Follow-up recommendations:  Activity:  Activity as tolerated Diet:  Regular diet Other:  Patient will be following up with her usual outpatient doctor through the end of pregnancy and after  Comments: Discharge home prescriptions for Zoloft and prenatal vitamins provided.  Patient agreeable to plan.  Signed: Mordecai Rasmussen, MD 11/12/2017, 11:20 AM

## 2017-11-12 NOTE — Progress Notes (Signed)
Recreation Therapy Notes   Date: 11/12/2017  Time: 9:30 am  Location: Craft Room  Behavioral response: Appropriate  Intervention Topic: Communication  Discussion/Intervention:  Group content today was focused on communication. The group defined communication and ways to communicate with others. Individuals stated reason why communication is important and some reasons to communicate with others. Patients expressed if they thought they were good at communicating with others and ways they could improve their communication skills. The group identified important parts of communication and some experiences they have had in the past with communication. The group participated in the intervention "What is that?", where they had a chance to test out their communication skills and identify ways to improve their communication techniques.  Clinical Observations/Feedback:  Patient came to group and was focused on what her peers and staff had to say about communication. She was social with peers and staff during group. Quintan Saldivar LRT/CTRS          Swain Acree 11/12/2017 11:43 AM

## 2017-11-12 NOTE — Progress Notes (Signed)
Recreation Therapy Notes  INPATIENT RECREATION TR PLAN  Patient Details Name: Desiree Sherman MRN: [id number] DOB: 1995-11-15 Today's Date: 11/12/2017  Rec Therapy Plan Is patient appropriate for Therapeutic Recreation?: Yes Treatment times per week: at least 3 Estimated Length of Stay: 5-7 days TR Treatment/Interventions: Group participation (Comment)  Discharge Criteria Pt will be discharged from therapy if:: Discharged Treatment plan/goals/alternatives discussed and agreed upon by:: Patient/family  Discharge Summary Short term goals set: Patient will identify benefit of making healthy decisions post d/c within 5 recreation therapy group sessions Short term goals met: Not met Progress toward goals comments: Groups attended Which groups?: Communication Reason goals not met: Patient spent most of her time in her room Therapeutic equipment acquired: N/A Reason patient discharged from therapy: Discharge from hospital Pt/family agrees with progress & goals achieved: Yes Date patient discharged from therapy: 11/12/17   Shaheen Mende 11/12/2017, 12:09 PM

## 2017-11-12 NOTE — Plan of Care (Signed)
Patient is alert and oriented, denies SI, HI and AVH. Patient affect is pleasant and cooperative with medications and attending groups. Patient looking forward to discharge. Nurse will continue to monitor.

## 2017-11-12 NOTE — BHH Group Notes (Signed)
11/12/2017 1PM  Type of Therapy/Topic:  Group Therapy:  Feelings about Diagnosis  Participation Level:  Active   Description of Group:   This group will allow patients to explore their thoughts and feelings about diagnoses they have received. Patients will be guided to explore their level of understanding and acceptance of these diagnoses. Facilitator will encourage patients to process their thoughts and feelings about the reactions of others to their diagnosis and will guide patients in identifying ways to discuss their diagnosis with significant others in their lives. This group will be process-oriented, with patients participating in exploration of their own experiences, giving and receiving support, and processing challenge from other group members.   Therapeutic Goals: 1. Patient will demonstrate understanding of diagnosis as evidenced by identifying two or more symptoms of the disorder 2. Patient will be able to express two feelings regarding the diagnosis 3. Patient will demonstrate their ability to communicate their needs through discussion and/or role play  Summary of Patient Progress: Actively and appropriately engaged in the group. Patient was able to provide support and validation to other group members.Patient practiced active listening when interacting with the facilitator and other group members. Pt. Reports being in the acceptance stage of grief  She says that she wants to "stay focused". Patient in still in the process of obtaining treatment goals.        Therapeutic Modalities:   Cognitive Behavioral Therapy Brief Therapy Feelings Identification    Johny Shears, LCSW 11/12/2017 1:51 PM

## 2017-11-12 NOTE — Progress Notes (Signed)
Patient ID: Desiree Sherman, female   DOB: 1995/10/20, 22 y.o.   MRN: [id number] Pleasant on approach, logical, coherent, mood and affect appropriate, "the babe is doing fine, thanks for asking..", denied pain, denied SI/HI/AVH; interacting with peers and staffs.

## 2017-11-12 NOTE — Discharge Planning (Signed)
Patient is alert and oriented X 4. Denies SI, HI and AVH. Patient received discharge instructions as well as paper prescriptions, and verbalized understanding. Patient verified all belongings in the locker present. Patient escorted to bus stop by the [place].

## 2017-11-12 NOTE — BHH Suicide Risk Assessment (Signed)
[facility] Admission Suicide Risk Assessment   Nursing information obtained from:  Patient Demographic factors:  Adolescent or young adult, Low socioeconomic status, Unemployed Current Mental Status:  NA(patient denies SI) Loss Factors:  Financial problems / change in socioeconomic status Historical Factors:  NA Risk Reduction Factors:  Pregnancy, Responsible for children under 21 years of age, Living with another person, especially a relative  Total Time spent with patient: 45 minutes Principal Problem: Severe major depression, single episode, without psychotic features (HCC) Diagnosis:   Patient Active Problem List   Diagnosis Date Noted  . Depression [F32.9] 11/08/2017  . Severe major depression, single episode, without psychotic features (HCC) [F32.2] 11/08/2017  . Depression affecting pregnancy, antepartum [O99.340, F32.9] 11/07/2017  . Depression with suicidal ideation [F32.9, R45.851] 11/07/2017   Subjective Data: Patient is denying any suicidal ideation.  Mood feels more stable.  She is eating regularly.  No longer feels hopeless.  Able to articulate positive thoughts and plans for the future.  Continued Clinical Symptoms:  Alcohol Use Disorder Identification Test Final Score (AUDIT): 0 The "Alcohol Use Disorders Identification Test", Guidelines for Use in Primary Care, Second Edition.  World Science writer [facility]). Score between 0-7:  no or low risk or alcohol related problems. Score between 8-15:  moderate risk of alcohol related problems. Score between 16-19:  high risk of alcohol related problems. Score 20 or above:  warrants further diagnostic evaluation for alcohol dependence and treatment.   CLINICAL FACTORS:   Depression:   Anhedonia   Musculoskeletal: Strength & Muscle Tone: within normal limits Gait & Station: normal Patient leans: N/A  Psychiatric Specialty Exam: Physical Exam  Nursing note and vitals reviewed. Constitutional: She appears well-developed and  well-nourished.  HENT:  Head: Normocephalic and atraumatic.  Eyes: Pupils are equal, round, and reactive to light. Conjunctivae are normal.  Neck: Normal range of motion.  Cardiovascular: Regular rhythm and normal heart sounds.  Respiratory: Effort normal.  GI: Soft.  Musculoskeletal: Normal range of motion.  Neurological: She is alert.  Skin: Skin is warm and dry.  Psychiatric: Judgment normal. Her affect is blunt. Her speech is delayed. She is not agitated, not aggressive, not hyperactive and not combative. Thought content is not paranoid. Cognition and memory are normal. She expresses no homicidal and no suicidal ideation.    Review of Systems  Constitutional: Negative.   HENT: Negative.   Eyes: Negative.   Respiratory: Negative.   Cardiovascular: Negative.   Gastrointestinal: Negative.   Musculoskeletal: Negative.   Skin: Negative.   Neurological: Negative.   Psychiatric/Behavioral: Positive for depression. Negative for hallucinations, memory loss, substance abuse and suicidal ideas. The patient is not nervous/anxious and does not have insomnia.     Blood pressure (!) 106/57, pulse 86, temperature (!) 97.5 F (36.4 C), temperature source Oral, resp. rate 18, height  (1.676 m), weight 72.8 kg (160 lb 8 oz), SpO2 100 %, unknown if currently breastfeeding.Body mass index is 25.91 kg/m.  General Appearance: Casual  Eye Contact:  Good  Speech:  Clear and Coherent  Volume:  Normal  Mood:  Anxious  Affect:  Congruent  Thought Process:  Goal Directed  Orientation:  Full (Time, Place, and Person)  Thought Content:  Logical  Suicidal Thoughts:  No  Homicidal Thoughts:  No  Memory:  Immediate;   Fair Recent;   Fair Remote;   Fair  Judgement:  Fair  Insight:  Fair  Psychomotor Activity:  Normal  Concentration:  Concentration: Fair  Recall:  Fair  Fund of Knowledge:  Fair  Language:  Fair  Akathisia:  No  Handed:  Right  AIMS (if indicated):     Assets:  Desire for  Improvement Housing Physical Health  ADL's:  Intact  Cognition:  WNL  Sleep:  Number of Hours: 6      COGNITIVE FEATURES THAT CONTRIBUTE TO RISK:  Loss of executive function    SUICIDE RISK:   Minimal: No identifiable suicidal ideation.  Patients presenting with no risk factors but with morbid ruminations; may be classified as minimal risk based on the severity of the depressive symptoms  PLAN OF CARE: Patient is being discharged back to home referred for outpatient mental health follow-up and continued care through her usual obstetric practice  I certify that inpatient services furnished can reasonably be expected to improve the patient's condition.   Mordecai Rasmussen, MD 11/12/2017, 11:14 AM

## 2017-12-12 LAB — OB RESULTS CONSOLE GBS: GBS: NEGATIVE

## 2017-12-30 ENCOUNTER — Inpatient Hospital Stay (HOSPITAL_COMMUNITY)
Admission: AD | Admit: 2017-12-30 | Discharge: 2018-01-01 | DRG: 807 | Disposition: A | Discharge status: Home / Self Care | Payer: Medicaid Other | Attending: Obstetrics and Gynecology | Admitting: Obstetrics and Gynecology

## 2017-12-30 ENCOUNTER — Other Ambulatory Visit: Payer: Self-pay

## 2017-12-30 ENCOUNTER — Encounter (HOSPITAL_COMMUNITY): Payer: Self-pay

## 2017-12-30 DIAGNOSIS — Z3483 Encounter for supervision of other normal pregnancy, third trimester: Secondary | ICD-10-CM | POA: Diagnosis present

## 2017-12-30 DIAGNOSIS — O99344 Other mental disorders complicating childbirth: Principal | ICD-10-CM | POA: Diagnosis present

## 2017-12-30 DIAGNOSIS — F329 Major depressive disorder, single episode, unspecified: Secondary | ICD-10-CM | POA: Diagnosis present

## 2017-12-30 DIAGNOSIS — Z3A37 37 weeks gestation of pregnancy: Secondary | ICD-10-CM | POA: Diagnosis not present

## 2017-12-30 DIAGNOSIS — Z3490 Encounter for supervision of normal pregnancy, unspecified, unspecified trimester: Secondary | ICD-10-CM

## 2017-12-30 LAB — CBC
HCT: 38.3 % (ref 36.0–46.0)
Hemoglobin: 12.8 g/dL (ref 12.0–15.0)
MCH: 29.6 pg (ref 26.0–34.0)
MCHC: 33.4 g/dL (ref 30.0–36.0)
MCV: 88.7 fL (ref 78.0–100.0)
PLATELETS: 156 10*3/uL (ref 150–400)
RBC: 4.32 MIL/uL (ref 3.87–5.11)
RDW: 13.4 % (ref 11.5–15.5)
WBC: 12.3 10*3/uL — AB (ref 4.0–10.5)

## 2017-12-30 LAB — TYPE AND SCREEN
ABO/RH(D): O POS
Antibody Screen: NEGATIVE

## 2017-12-30 MED ORDER — OXYTOCIN BOLUS FROM INFUSION
500.0000 mL | Freq: Once | INTRAVENOUS | Status: AC
  Administered 2017-12-30: 500 mL via INTRAVENOUS

## 2017-12-30 MED ORDER — DIBUCAINE 1 % RE OINT: 1.0000 "application " | TOPICAL_OINTMENT | RECTAL | Status: DC | PRN

## 2017-12-30 MED ORDER — LACTATED RINGERS IV SOLN: 500.0000 mL | INTRAVENOUS | Status: DC | PRN

## 2017-12-30 MED ORDER — OXYCODONE HCL 5 MG PO TABS: 5.0000 mg | ORAL_TABLET | ORAL | Status: DC | PRN

## 2017-12-30 MED ORDER — DIPHENHYDRAMINE HCL 50 MG/ML IJ SOLN: 12.5000 mg | INTRAMUSCULAR | Status: DC | PRN

## 2017-12-30 MED ORDER — ONDANSETRON HCL 4 MG/2ML IJ SOLN: 4.0000 mg | INTRAMUSCULAR | Status: DC | PRN

## 2017-12-30 MED ORDER — PHENYLEPHRINE 40 MCG/ML (10ML) SYRINGE FOR IV PUSH (FOR BLOOD PRESSURE SUPPORT)
80.0000 ug | PREFILLED_SYRINGE | INTRAVENOUS | Status: DC | PRN
  Filled 2017-12-30: qty 5

## 2017-12-30 MED ORDER — LIDOCAINE HCL (PF) 1 % IJ SOLN
30.0000 mL | INTRAMUSCULAR | Status: DC | PRN
  Administered 2017-12-30: 30 mL via SUBCUTANEOUS
  Filled 2017-12-30: qty 30

## 2017-12-30 MED ORDER — BENZOCAINE-MENTHOL 20-0.5 % EX AERO
1.0000 "application " | INHALATION_SPRAY | CUTANEOUS | Status: DC | PRN
  Administered 2017-12-30: 1 via TOPICAL
  Filled 2017-12-30: qty 56

## 2017-12-30 MED ORDER — TERBUTALINE SULFATE 1 MG/ML IJ SOLN
0.2500 mg | Freq: Once | INTRAMUSCULAR | Status: DC | PRN
  Filled 2017-12-30: qty 1

## 2017-12-30 MED ORDER — LACTATED RINGERS IV SOLN: 500.0000 mL | Freq: Once | INTRAVENOUS | Status: DC

## 2017-12-30 MED ORDER — SENNOSIDES-DOCUSATE SODIUM 8.6-50 MG PO TABS
2.0000 | ORAL_TABLET | ORAL | Status: DC
  Administered 2017-12-30: 2 via ORAL
  Filled 2017-12-30 (×2): qty 2

## 2017-12-30 MED ORDER — SIMETHICONE 80 MG PO CHEW: 80.0000 mg | CHEWABLE_TABLET | ORAL | Status: DC | PRN

## 2017-12-30 MED ORDER — PRENATAL MULTIVITAMIN CH
1.0000 | ORAL_TABLET | Freq: Every day | ORAL | Status: DC
  Administered 2017-12-31 – 2018-01-01 (×2): 1 via ORAL
  Filled 2017-12-30 (×2): qty 1

## 2017-12-30 MED ORDER — ACETAMINOPHEN 325 MG PO TABS: 650.0000 mg | ORAL_TABLET | ORAL | Status: DC | PRN

## 2017-12-30 MED ORDER — OXYTOCIN 40 UNITS IN LACTATED RINGERS INFUSION - SIMPLE MED
2.5000 [IU]/h | INTRAVENOUS | Status: DC
  Administered 2017-12-30: 2.5 [IU]/h via INTRAVENOUS
  Filled 2017-12-30: qty 1000

## 2017-12-30 MED ORDER — COCONUT OIL OIL: 1.0000 "application " | TOPICAL_OIL | Status: DC | PRN

## 2017-12-30 MED ORDER — ONDANSETRON HCL 4 MG PO TABS: 4.0000 mg | ORAL_TABLET | ORAL | Status: DC | PRN

## 2017-12-30 MED ORDER — EPHEDRINE 5 MG/ML INJ
10.0000 mg | INTRAVENOUS | Status: DC | PRN
  Filled 2017-12-30: qty 2

## 2017-12-30 MED ORDER — IBUPROFEN 600 MG PO TABS
600.0000 mg | ORAL_TABLET | Freq: Four times a day (QID) | ORAL | Status: DC
  Administered 2017-12-30 – 2018-01-01 (×7): 600 mg via ORAL
  Filled 2017-12-30 (×7): qty 1

## 2017-12-30 MED ORDER — TETANUS-DIPHTH-ACELL PERTUSSIS 5-2.5-18.5 LF-MCG/0.5 IM SUSP: 0.5000 mL | Freq: Once | INTRAMUSCULAR | Status: DC

## 2017-12-30 MED ORDER — ONDANSETRON HCL 4 MG/2ML IJ SOLN: 4.0000 mg | Freq: Four times a day (QID) | INTRAMUSCULAR | Status: DC | PRN

## 2017-12-30 MED ORDER — LACTATED RINGERS IV SOLN: INTRAVENOUS | Status: DC

## 2017-12-30 MED ORDER — SERTRALINE HCL 50 MG PO TABS
50.0000 mg | ORAL_TABLET | Freq: Every day | ORAL | Status: DC
  Administered 2017-12-31 – 2018-01-01 (×2): 50 mg via ORAL
  Filled 2017-12-30 (×3): qty 1

## 2017-12-30 MED ORDER — DIPHENHYDRAMINE HCL 25 MG PO CAPS: 25.0000 mg | ORAL_CAPSULE | Freq: Four times a day (QID) | ORAL | Status: DC | PRN

## 2017-12-30 MED ORDER — WITCH HAZEL-GLYCERIN EX PADS: 1.0000 "application " | MEDICATED_PAD | CUTANEOUS | Status: DC | PRN

## 2017-12-30 MED ORDER — ZOLPIDEM TARTRATE 5 MG PO TABS: 5.0000 mg | ORAL_TABLET | Freq: Every evening | ORAL | Status: DC | PRN

## 2017-12-30 MED ORDER — OXYTOCIN 40 UNITS IN LACTATED RINGERS INFUSION - SIMPLE MED
1.0000 m[IU]/min | INTRAVENOUS | Status: DC
  Administered 2017-12-30: 2 m[IU]/min via INTRAVENOUS

## 2017-12-30 MED ORDER — FENTANYL 2.5 MCG/ML BUPIVACAINE 1/10 % EPIDURAL INFUSION (WH - ANES)
14.0000 mL/h | INTRAMUSCULAR | Status: DC | PRN
  Filled 2017-12-30: qty 100

## 2017-12-30 MED ORDER — OXYCODONE HCL 5 MG PO TABS: 10.0000 mg | ORAL_TABLET | ORAL | Status: DC | PRN

## 2017-12-30 MED ORDER — PHENYLEPHRINE 40 MCG/ML (10ML) SYRINGE FOR IV PUSH (FOR BLOOD PRESSURE SUPPORT)
80.0000 ug | PREFILLED_SYRINGE | INTRAVENOUS | Status: DC | PRN
  Filled 2017-12-30: qty 5
  Filled 2017-12-30: qty 10

## 2017-12-30 MED ORDER — SOD CITRATE-CITRIC ACID 500-334 MG/5ML PO SOLN: 30.0000 mL | ORAL | Status: DC | PRN

## 2017-12-30 NOTE — H&P (Signed)
Desiree Sherman is a 22 y.o. G2P0010 female presenting at 48 4/[redacted]wks gestation in active labor. Pt reports begun having "tightening" about 4 hours ago. In MAU pt noted to be 7-8cm dil. She desires no pain medication in labor. She is GBS positive. She is dated per a 9 week [place]. Pts prenatal care was complicated with exacerbation of depression despite restarting on zoloft. She was admitted to [place] for suicidal ideations at [redacted] weeks gestation. She is also receiving counseling therapy.  OB History    Gravida  2   Para  1   Term  1   Preterm      AB      Living  1     SAB      TAB      Ectopic      Multiple      Live Births  1          Past Medical History:  Diagnosis Date  . Chlamydia    during pregnancy  . Depression    Past Surgical History:  Procedure Laterality Date  . CHEST SURGERY     fell onto glass table, glass removed  . NO PAST SURGERIES     Family History: family history includes Asthma in her mother. Social History:  reports that she has never smoked. She has never used smokeless tobacco. She reports that she does not drink alcohol or use drugs.     Maternal Diabetes: No Genetic Screening: Normal Maternal Ultrasounds/Referrals: Normal Fetal Ultrasounds or other Referrals:  None Maternal Substance Abuse:  No Significant Maternal Medications:  Meds include: Zoloft Significant Maternal Lab Results:  Lab values include: Group B Strep negative Other Comments:  None  Review of Systems  Constitutional: Negative for chills, fever, malaise/fatigue and weight loss.  HENT: Negative for hearing loss.   Eyes: Negative for blurred vision and double vision.  Respiratory: Negative for shortness of breath.   Cardiovascular: Negative for chest pain.  Gastrointestinal: Positive for abdominal pain. Negative for heartburn, nausea and vomiting.  Genitourinary: Negative for dysuria.  Neurological: Negative for dizziness and headaches.   Psychiatric/Behavioral: Positive for depression and suicidal ideas. Negative for hallucinations and substance abuse. The patient is not nervous/anxious.    Maternal Medical History:  Reason for admission: Contractions.  Nausea.  Contractions: Onset was 3-5 hours ago.    Fetal activity: Perceived fetal activity is normal.   Last perceived fetal movement was within the past hour.    Prenatal complications: Suicidal ideations due to depression  Prenatal Complications - Diabetes: none.    Dilation: 8 Effacement (%): 90 Station: Plus 1 Exam by:: Dr. Mindi Slicker Blood pressure 129/81, pulse (!) 102, temperature 97.8 F (36.6 C), temperature source Oral, resp. rate 20, height 5\' 6"  (1.676 m), weight 170 lb (77.1 kg), unknown if currently breastfeeding. Maternal Exam:  Uterine Assessment: Contraction strength is moderate.  Contraction frequency is regular.   Abdomen: Patient reports generalized tenderness.  Estimated fetal weight is AGA.   Fetal presentation: vertex  Introitus: Normal vulva. Vulva is negative for condylomata and lesion.  Normal vagina.  Vagina is negative for condylomata.  Amniotic fluid character: clear.  Pelvis: adequate for delivery.   Cervix: Cervix evaluated by digital exam.     Fetal Exam Fetal Monitor Review: Baseline rate: 120.  Variability: moderate (6-25 bpm).   Pattern: accelerations present and no decelerations.    Fetal State Assessment: Category I - tracings are normal.     Physical Exam  Constitutional:  She is oriented to person, place, and time. She appears well-developed and well-nourished.  HENT:  Head: Normocephalic.  Neck: Normal range of motion.  Cardiovascular: Normal rate.  Respiratory: Effort normal.  GI: Soft. There is generalized tenderness.  Genitourinary: Vagina normal and uterus normal. Vulva exhibits no lesion.  Musculoskeletal: Normal range of motion.  Neurological: She is alert and oriented to person, place, and time.   Skin: Skin is warm.  Psychiatric: She has a normal mood and affect. Her behavior is normal. Judgment and thought content normal.    Prenatal labs: ABO, Rh:   Antibody:   Rubella:   RPR:    HBsAg:    HIV:    GBS:     Assessment/Plan: G2P1001 at 37 4/7wks in active labor - admitted AROM with clear copious fluid return noted Anticipate svd imminently   Cathrine MusterCecilia W Johnatan Baskette 12/30/2017, 6:24 PM

## 2017-12-30 NOTE — MAU Note (Signed)
Pt presents to MAU with c/o ctx pain that started today at 3 pm. Pt denies VB and LOF. +FM

## 2017-12-31 LAB — CBC
HEMATOCRIT: 34.8 % — AB (ref 36.0–46.0)
HEMOGLOBIN: 11.8 g/dL — AB (ref 12.0–15.0)
MCH: 30.2 pg (ref 26.0–34.0)
MCHC: 33.9 g/dL (ref 30.0–36.0)
MCV: 89 fL (ref 78.0–100.0)
Platelets: 156 10*3/uL (ref 150–400)
RBC: 3.91 MIL/uL (ref 3.87–5.11)
RDW: 13.5 % (ref 11.5–15.5)
WBC: 17.8 10*3/uL — ABNORMAL HIGH (ref 4.0–10.5)

## 2017-12-31 LAB — RPR: RPR Ser Ql: NONREACTIVE

## 2017-12-31 NOTE — Progress Notes (Signed)
CSW received consult for hx of Depression.  When CSW arrived and MOB was holding infant and MOB's 2 female guest were observing MOB's and infant's interaction. With MOB's permission, CSW asked MOB's guest to leave the room in order for MOB to meet CSW in private. CSW met with MOB to offer support and complete assessment.  MOB was polite, forthcoming, and easy to engage.   CSW asked about MOB's MH hx and MOB denied having a dx of depression.  MOB stated, " I was just sad and was going through a lot of things with the kids father. I checked myself into the hospital for a couple of days and now I feel better."  MOB acknowledged that MOB was prescribed Zoloft and reported taking medication consistently. MOB recognized that medication has also contributed to MOB's mood improving.   CSW provided education regarding the baby blues period vs. perinatal mood disorders, discussed treatment and gave resources for mental health follow up if concerns arise.  CSW recommends self-evaluation during the postpartum time period using the New Mom Checklist from Postpartum Progress and encouraged MOB to contact a medical professional if symptoms are noted at any time. MOB presented with insight and awareness and did not demonstrate any acute symptoms. MOB also reported having a good support team that consists of MOB's and FOB's family members. CSW assessed for safety and MOB denied SI, HI, and DV.  CSW also offered MOB resources for outpatient counseling and MOB declined.   CSW identifies no further need for intervention and no barriers to discharge at this time.  Makyia Erxleben Boyd-Gilyard, MSW, LCSW Clinical Social Work [phone]  

## 2017-12-31 NOTE — Progress Notes (Signed)
Post Partum Day 1 Subjective: no complaints and tolerating PO .  Some cramping, but pain controlled.  Smiling and engaged this AM with family in room.  Objective: Blood pressure 104/60, pulse 75, temperature 98.3 F (36.8 C), temperature source Oral, resp. rate 18, height 5\' 6"  (1.676 m), weight 77.1 kg (170 lb), unknown if currently breastfeeding.  Physical Exam:  General: alert and cooperative Lochia: appropriate Uterine Fundus: firm   Recent Labs    12/30/17 1812 12/31/17 0612  HGB 12.8 11.8*  HCT 38.3 34.8*    Assessment/Plan: Plan for discharge tomorrow  Restarted zoloft and pt states had been working well prior to admission, pt's mom in room and states she will be monitoring pt for any increased sx of depression Plan circumcision in office   LOS: 1 day   Oliver PilaKathy W Rylinn Linzy 12/31/2017, 9:24 AM

## 2017-12-31 NOTE — Progress Notes (Signed)
CSW acknowledges consult.  CSW attempted to meet with MOB, however MOB had several room guest.  CSW will attempt to visit with MOB at a later time.   Sher Shampine Boyd-Gilyard, MSW, LCSW Clinical Social Work [phone]  

## 2018-01-01 MED ORDER — IBUPROFEN 600 MG PO TABS: 600.0000 mg | ORAL_TABLET | Freq: Four times a day (QID) | ORAL | 0 refills | Status: DC

## 2018-01-01 NOTE — Progress Notes (Signed)
PPD #2 No problems Afeb, VSS D/c home 

## 2018-01-01 NOTE — Lactation Note (Signed)
This note was copied from a baby's chart.

## 2018-01-01 NOTE — Discharge Instructions (Signed)
As per discharge pamphlet °

## 2018-01-01 NOTE — Discharge Summary (Signed)
    OB Discharge Summary     Patient Name: Desiree BryantJermilah Danielle Sherman DOB: 1996-02-05 MRN: [id number]  Date of admission: 12/30/2017 Delivering MD: Pryor OchoaBANGA, CECILIA [facility]   Date of discharge: 01/01/2018  Admitting diagnosis: 37WKS CTX  Intrauterine pregnancy: 256w4d     Secondary diagnosis:  Active Problems:   Term pregnancy   SVD (spontaneous vaginal delivery)   Postpartum care following vaginal delivery      Discharge diagnosis: Term Pregnancy Delivered                                  Hospital course:  Onset of Labor With Vaginal Delivery     22 y.o. yo [id number] at 936w4d was admitted in Active Labor on 12/30/2017. Patient had an uncomplicated labor course as follows:  Membrane Rupture Time/Date: 6:15 PM ,12/30/2017   Intrapartum Procedures: Episiotomy: None [1]                                         Lacerations:  2nd degree [3]  Patient had a delivery of a Viable infant. 12/30/2017  Information for the patient's newborn:  Desma MaximJohnson, Boy Marnesha [[id number]][[id number]]  Delivery Method: Vaginal, Spontaneous(Filed from Delivery Summary)    Pateint had an uncomplicated postpartum course.  She is ambulating, tolerating a regular diet, passing flatus, and urinating well. Patient is discharged home in stable condition on 01/01/18.   Physical exam  Vitals:   12/31/17 0544 12/31/17 0820 12/31/17 1230 01/01/18 0530  BP: 106/65 104/60 120/78 103/76  Pulse: 71 75 79 72  Resp: 18 18 18 18   Temp: 98.2 F (36.8 C) 98.3 F (36.8 C) 98.2 F (36.8 C) 98.3 F (36.8 C)  TempSrc: Oral Oral Oral Oral  Weight:      Height:       General: alert Lochia: appropriate Uterine Fundus: firm  Labs: Lab Results  Component Value Date   WBC 17.8 (H) 12/31/2017   HGB 11.8 (L) 12/31/2017   HCT 34.8 (L) 12/31/2017   MCV 89.0 12/31/2017   PLT 156 12/31/2017   No flowsheet data found.  Discharge instruction: per After Visit Summary and "Baby and Me Booklet".  After visit meds:  Allergies as of 01/01/2018    No Known Allergies     Medication List    TAKE these medications   ibuprofen 600 MG tablet Commonly known as:  ADVIL,MOTRIN Take 1 tablet (600 mg total) by mouth every 6 (six) hours.   prenatal multivitamin Tabs tablet Take 1 tablet by mouth daily at 12 noon.   sertraline 50 MG tablet Commonly known as:  ZOLOFT Take 1 tablet (50 mg total) by mouth daily.       Diet: routine diet  Activity: Advance as tolerated. Pelvic rest for 6 weeks.   Outpatient follow up:6 weeks   Newborn Data: Live born female  Birth Weight: 8 lb 2.5 oz (3700 g) APGAR: 8, 9  Newborn Delivery   Birth date/time:  12/30/2017 21:16:00 Delivery type:  Vaginal, Spontaneous     Baby Feeding: Breast Disposition:home with mother   01/01/2018 Zenaida Nieceodd D Messi Twedt, MD

## 2018-02-28 ENCOUNTER — Emergency Department (HOSPITAL_COMMUNITY)
Admission: EM | Admit: 2018-02-28 | Discharge: 2018-02-28 | Disposition: A | Discharge status: Home / Self Care | Payer: Medicaid Other | Attending: Emergency Medicine | Admitting: Emergency Medicine

## 2018-02-28 ENCOUNTER — Encounter (HOSPITAL_COMMUNITY): Payer: Self-pay | Admitting: Emergency Medicine

## 2018-02-28 DIAGNOSIS — Z79899 Other long term (current) drug therapy: Secondary | ICD-10-CM | POA: Insufficient documentation

## 2018-02-28 DIAGNOSIS — H1032 Unspecified acute conjunctivitis, left eye: Secondary | ICD-10-CM

## 2018-02-28 DIAGNOSIS — H10022 Other mucopurulent conjunctivitis, left eye: Secondary | ICD-10-CM | POA: Diagnosis not present

## 2018-02-28 DIAGNOSIS — H5789 Other specified disorders of eye and adnexa: Secondary | ICD-10-CM | POA: Diagnosis present

## 2018-02-28 MED ORDER — ERYTHROMYCIN 5 MG/GM OP OINT
TOPICAL_OINTMENT | Freq: Once | OPHTHALMIC | Status: AC
  Administered 2018-02-28: 18:00:00 via OPHTHALMIC
  Filled 2018-02-28: qty 3.5

## 2018-02-28 MED ORDER — FLUORESCEIN SODIUM 1 MG OP STRP
1.0000 | ORAL_STRIP | Freq: Once | OPHTHALMIC | Status: AC
  Administered 2018-02-28: 1 via OPHTHALMIC
  Filled 2018-02-28: qty 1

## 2018-02-28 MED ORDER — TETRACAINE HCL 0.5 % OP SOLN
1.0000 [drp] | Freq: Once | OPHTHALMIC | Status: AC
  Administered 2018-02-28: 1 [drp] via OPHTHALMIC
  Filled 2018-02-28: qty 4

## 2018-02-28 NOTE — Discharge Instructions (Addendum)
Use the eye ointment 3 times a day. Follow up with the eye doctor if symptoms persist. Return here as needed.

## 2018-02-28 NOTE — ED Provider Notes (Signed)
[facility]-EMERGENCY DEPT Provider Note   CSN: [id number] Arrival date & time: 02/28/18  1702     History   Chief Complaint Chief Complaint  Patient presents with  . Eye Pain    HPI Desiree Sherman is a 22 y.o. female who presents to the ED with left eye itching and irritation. The symptoms started 3 days ago. Patient reports that when she wakes in the mornings there is yellow crusting on her eyelashes. Patient denies visual changes.   HPI  Past Medical History:  Diagnosis Date  . Chlamydia    during pregnancy  . Depression     Patient Active Problem List   Diagnosis Date Noted  . Term pregnancy 12/30/2017  . SVD (spontaneous vaginal delivery) 12/30/2017  . Postpartum care following vaginal delivery 12/30/2017  . Depression 11/08/2017  . Severe major depression, single episode, without psychotic features (HCC) 11/08/2017  . Depression affecting pregnancy, antepartum 11/07/2017  . Depression with suicidal ideation 11/07/2017    Past Surgical History:  Procedure Laterality Date  . CHEST SURGERY     fell onto glass table, glass removed  . NO PAST SURGERIES       OB History    Gravida  2   Para  2   Term  2   Preterm      AB      Living  2     SAB      TAB      Ectopic      Multiple  0   Live Births  2            Home Medications    Prior to Admission medications   Medication Sig Start Date End Date Taking? Authorizing Provider  ibuprofen (ADVIL,MOTRIN) 600 MG tablet Take 1 tablet (600 mg total) by mouth every 6 (six) hours. 01/01/18   Meisinger, Tawanna Coolerodd, MD  Prenatal Vit-Fe Fumarate-FA (PRENATAL MULTIVITAMIN) TABS tablet Take 1 tablet by mouth daily at 12 noon. 11/12/17   Clapacs, Jackquline DenmarkJohn T, MD  sertraline (ZOLOFT) 50 MG tablet Take 1 tablet (50 mg total) by mouth daily. 11/13/17   Clapacs, Jackquline DenmarkJohn T, MD    Family History Family History  Problem Relation Age of Onset  . Asthma Mother     Social History Social  History   Tobacco Use  . Smoking status: Never Smoker  . Smokeless tobacco: Never Used  Substance Use Topics  . Alcohol use: No  . Drug use: No     Allergies   Patient has no known allergies.   Review of Systems Review of Systems  Eyes: Positive for photophobia, discharge, redness and itching.  All other systems reviewed and are negative.    Physical Exam Updated Vital Signs BP 124/70 (BP Location: Right Arm)   Pulse 64   Temp 98.4 F (36.9 C) (Oral)   Resp 17   SpO2 99%   Physical Exam  Constitutional: She appears well-developed and well-nourished. No distress.  HENT:  Head: Normocephalic.  Eyes: Pupils are equal, round, and reactive to light. EOM are normal. Left eye exhibits discharge. Left eye exhibits no hordeolum. No foreign body present in the left eye. Left conjunctiva is injected.  Slit lamp exam:      The left eye shows no corneal abrasion, no corneal ulcer, no foreign body and no fluorescein uptake.  Neck: Neck supple.  Cardiovascular: Normal rate.  Pulmonary/Chest: Effort normal. No respiratory distress.  Musculoskeletal: Normal range of motion.  Neurological:  She is alert.  Skin: Skin is warm and dry.  Psychiatric: She has a normal mood and affect.  Nursing note and vitals reviewed.    ED Treatments / Results  Labs (all labs ordered are listed, but only abnormal results are displayed) Labs Reviewed - No data to display  Radiology No results found.  Procedures Procedures (including critical care time)  Medications Ordered in ED Medications  tetracaine (PONTOCAINE) 0.5 % ophthalmic solution 1 drop (1 drop Left Eye Given 02/28/18 1738)  fluorescein ophthalmic strip 1 strip (1 strip Left Eye Given 02/28/18 1738)  erythromycin ophthalmic ointment ( Left Eye Given 02/28/18 1810)     Initial Impression / Assessment and Plan / ED Course  I have reviewed the triage vital signs and the nursing notes. Desiree Sherman presents with  symptoms consistent with bacterial conjunctivitis.  Purulent discharge exam.  No corneal abrasions, entrapment, consensual photophobia, or dendritic staining with fluorescein study.  Presentation non-concerning for iritis, corneal abrasions, or HSV.  No evidence of preseptal or orbital cellulitis.  Pt is not a contact lens wearer.  Patient will be given erythromycin ophthalmic.  Personal hygiene and frequent handwashing discussed.  Patient advised to followup with ophthalmologist for reevaluation in several days..  Patient verbalizes understanding and is agreeable with discharge.   Final Clinical Impressions(s) / ED Diagnoses   Final diagnoses:  Acute bacterial conjunctivitis of left eye    ED Discharge Orders    None       Kerrie Buffaloeese, Hope Upper K[place], NP 02/28/18 2231    Sabas SousBero, Michael M, MD 03/01/18 [phone]

## 2018-02-28 NOTE — ED Triage Notes (Signed)
Patient here from home reporting left eye itching, drainage, and redness x2 days.

## 2020-03-02 LAB — OB RESULTS CONSOLE HEPATITIS B SURFACE ANTIGEN: Hepatitis B Surface Ag: NEGATIVE

## 2020-03-02 LAB — OB RESULTS CONSOLE GC/CHLAMYDIA
Chlamydia: NEGATIVE
Gonorrhea: NEGATIVE

## 2020-03-02 LAB — OB RESULTS CONSOLE HIV ANTIBODY (ROUTINE TESTING): HIV: NONREACTIVE

## 2020-07-16 NOTE — L&D Delivery Note (Signed)
DELIVERY NOTE  Pt complete and at +2 station with urge to push. Pt pushed and delivered a viable female infant in ROA position. Anterior and posterior shoulders spontaneously delivered with next two pushes; body easily followed next. Infant placed on mothers abdomen and bulb suction of mouth and nose performed. Cord was then clamped and cut by patient. Cord blood obtained,  3VC. Baby had a vigorous spontaneous cry noted. Placenta then delivered at 2342 intact. Fundal massage performed and pitocin per protocol. Fundus firm. LUS atony with clots removed, IM methergine and TXA given, bleeding improved significantly. The following lacerations were noted: NONE.  Mother and baby stable. Counts correct. EBL 450cc  Infant time: 2338 Gender: female Placenta time: 2342 Apgars: 9/9 Weight: pending skin-to-skin

## 2020-08-19 ENCOUNTER — Other Ambulatory Visit: Payer: Self-pay

## 2020-08-19 ENCOUNTER — Encounter (HOSPITAL_COMMUNITY): Payer: Self-pay | Admitting: Emergency Medicine

## 2020-08-19 ENCOUNTER — Emergency Department (HOSPITAL_COMMUNITY)
Admission: EM | Admit: 2020-08-19 | Discharge: 2020-08-20 | Disposition: A | Discharge status: Home / Self Care | Payer: Medicaid Other | Attending: Emergency Medicine | Admitting: Emergency Medicine

## 2020-08-19 DIAGNOSIS — K0889 Other specified disorders of teeth and supporting structures: Secondary | ICD-10-CM | POA: Diagnosis not present

## 2020-08-19 MED ORDER — OXYCODONE-ACETAMINOPHEN 5-325 MG PO TABS
1.0000 | ORAL_TABLET | ORAL | Status: DC | PRN
  Filled 2020-08-19: qty 1

## 2020-08-19 MED ORDER — ACETAMINOPHEN 325 MG PO TABS
650.0000 mg | ORAL_TABLET | Freq: Once | ORAL | Status: AC
  Administered 2020-08-19: 650 mg via ORAL

## 2020-08-19 NOTE — ED Triage Notes (Signed)
Pt presents to ED POV. Pt c/o dental pain bilaterally. Pt reports that it began at the beginning of this week. Airway intact.

## 2020-08-20 MED ORDER — AMOXICILLIN 500 MG PO CAPS: 500.0000 mg | ORAL_CAPSULE | Freq: Three times a day (TID) | ORAL | 0 refills | Status: DC

## 2020-08-20 MED ORDER — AMOXICILLIN 500 MG PO CAPS
500.0000 mg | ORAL_CAPSULE | Freq: Once | ORAL | Status: AC
  Administered 2020-08-20: 500 mg via ORAL
  Filled 2020-08-20: qty 1

## 2020-08-20 NOTE — ED Notes (Signed)
Patient discharge instructions reviewed with pt caregiver. Discussed s/sx to return, PCP follow up, medications given/next dose due, and prescriptions. Caregiver verbalized understanding.   °

## 2020-08-20 NOTE — ED Provider Notes (Signed)
[facility] EMERGENCY DEPARTMENT Provider Note   CSN: [id number] Arrival date & time: 08/19/20  2029     History   Chief Complaint Chief Complaint  Patient presents with  . Dental Pain    HPI Desiree Sherman is a 25 y.o. female.  Patient presents to the emergency department with a dental complaint. Symptoms began about a week ago. The patient has tried to alleviate pain with tylenol, oragel, saltwater.  Pain rated as severe, characterized as throbbing in nature and located lower molars. Patient denies fever, night sweats, chills, difficulty swallowing or opening mouth, SOB, nuchal rigidity or decreased ROM of neck.  Patient does not have a dentist and requests a resource guide at discharge.  Of note, patient is pregnant.      HPI  Past Medical History:  Diagnosis Date  . Chlamydia    during pregnancy  . Depression     Patient Active Problem List   Diagnosis Date Noted  . Term pregnancy 12/30/2017  . SVD (spontaneous vaginal delivery) 12/30/2017  . Postpartum care following vaginal delivery 12/30/2017  . Depression 11/08/2017  . Severe major depression, single episode, without psychotic features (HCC) 11/08/2017  . Depression affecting pregnancy, antepartum 11/07/2017  . Depression with suicidal ideation 11/07/2017    Past Surgical History:  Procedure Laterality Date  . CHEST SURGERY     fell onto glass table, glass removed     OB History    Gravida  3   Para  2   Term  2   Preterm      AB      Living  2     SAB      IAB      Ectopic      Multiple  0   Live Births  2            Home Medications    Prior to Admission medications   Medication Sig Start Date End Date Taking? Authorizing Provider  ibuprofen (ADVIL,MOTRIN) 600 MG tablet Take 1 tablet (600 mg total) by mouth every 6 (six) hours. 01/01/18   Meisinger, Tawanna Cooler, MD  Prenatal Vit-Fe Fumarate-FA (PRENATAL MULTIVITAMIN) TABS tablet Take 1 tablet by mouth  daily at 12 noon. 11/12/17   Clapacs, Jackquline Denmark, MD  sertraline (ZOLOFT) 50 MG tablet Take 1 tablet (50 mg total) by mouth daily. 11/13/17   Clapacs, Jackquline Denmark, MD    Family History Family History  Problem Relation Age of Onset  . Asthma Mother     Social History Social History   Tobacco Use  . Smoking status: Never Smoker  . Smokeless tobacco: Never Used  Vaping Use  . Vaping Use: Never used  Substance Use Topics  . Alcohol use: No  . Drug use: No     Allergies   Patient has no known allergies.   Review of Systems Review of Systems  Constitutional: Negative for chills and fever.  HENT: Positive for dental problem. Negative for drooling.   Neurological: Negative for speech difficulty.  Psychiatric/Behavioral: Positive for sleep disturbance.     Physical Exam Updated Vital Signs BP 126/82 (BP Location: Right Arm)   Pulse 80   Temp 98.3 F (36.8 C) (Oral)   Resp 18   SpO2 100%   Physical Exam Physical Exam  Constitutional: Pt appears well-developed and well-nourished.  HENT:  Head: Normocephalic.  Mouth/Throat: Uvula is midline, oropharynx is clear and moist and mucous membranes are normal. No oral lesions. No uvula  swelling or lacerations. No oropharyngeal exudate, posterior oropharyngeal edema, posterior oropharyngeal erythema or tonsillar abscesses.  Poor dentition No gingival swelling, fluctuance or induration No gross abscess  No sublingual edema, tenderness to palpation, or sign of Ludwig's angina, or deep space infection Pain at tooth number 19 and 30 Eyes: Conjunctivae are normal. Pupils are equal, round, and reactive to light. Right eye exhibits no discharge. Left eye exhibits no discharge.  Neck: Normal range of motion. Neck supple.  No stridor Handling secretions without difficulty No nuchal rigidity No cervical lymphadenopathy Cardiovascular: Normal rate, regular rhythm and normal heart sounds.   Pulmonary/Chest: Effort normal. No respiratory distress.   Equal chest rise  Abdominal: Soft. Bowel sounds are normal. Pt exhibits no distension. There is no tenderness.  Lymphadenopathy: Pt has no cervical adenopathy.  Neurological: Pt is alert and oriented x 4  Skin: Skin is warm and dry.  Psychiatric: Pt has a normal mood and affect.  Nursing note and vitals reviewed.   ED Treatments / Results  Labs (all labs ordered are listed, but only abnormal results are displayed) Labs Reviewed - No data to display  EKG    Radiology No results found.  Procedures Procedures (including critical care time)  Medications Ordered in ED Medications  amoxicillin (AMOXIL) capsule 500 mg (has no administration in time range)  acetaminophen (TYLENOL) tablet 650 mg (650 mg Oral Given 08/19/20 2152)     Initial Impression / Assessment and Plan / ED Course  I have reviewed the triage vital signs and the nursing notes.  Pertinent labs & imaging results that were available during my care of the patient were reviewed by me and considered in my medical decision making (see chart for details).        Patient with dentalgia.  No abscess requiring immediate incision and drainage.  Exam not concerning for Ludwig's angina or pharyngeal abscess.  Will treat with amox. Limited to Tylenol for pain 2/2 pregnancy. Pt instructed to follow-up with dentist.  Discussed return precautions. Pt safe for discharge.   Final Clinical Impressions(s) / ED Diagnoses   Final diagnoses:  Toothache    ED Discharge Orders    None       Roxy Horseman, PA-C 08/20/20 0424    Nira Conn, MD 08/20/20 0730

## 2020-08-20 NOTE — ED Notes (Signed)
ED Provider at bedside. 

## 2020-08-28 ENCOUNTER — Other Ambulatory Visit: Payer: Self-pay

## 2020-08-28 ENCOUNTER — Inpatient Hospital Stay (HOSPITAL_COMMUNITY)
Admission: AD | Admit: 2020-08-28 | Discharge: 2020-08-30 | DRG: 807 | Disposition: A | Discharge status: Home / Self Care | Payer: Medicaid Other | Attending: Obstetrics and Gynecology | Admitting: Obstetrics and Gynecology

## 2020-08-28 ENCOUNTER — Encounter (HOSPITAL_COMMUNITY): Payer: Self-pay | Admitting: Obstetrics and Gynecology

## 2020-08-28 ENCOUNTER — Inpatient Hospital Stay (EMERGENCY_DEPARTMENT_HOSPITAL)
Admission: AD | Admit: 2020-08-28 | Discharge: 2020-08-28 | Disposition: A | Discharge status: Home / Self Care | Payer: Medicaid Other | Source: Home / Self Care | Attending: Obstetrics and Gynecology | Admitting: Obstetrics and Gynecology

## 2020-08-28 DIAGNOSIS — F419 Anxiety disorder, unspecified: Secondary | ICD-10-CM | POA: Diagnosis present

## 2020-08-28 DIAGNOSIS — F32A Depression, unspecified: Secondary | ICD-10-CM | POA: Diagnosis present

## 2020-08-28 DIAGNOSIS — Z3490 Encounter for supervision of normal pregnancy, unspecified, unspecified trimester: Secondary | ICD-10-CM

## 2020-08-28 DIAGNOSIS — Z20822 Contact with and (suspected) exposure to covid-19: Secondary | ICD-10-CM | POA: Diagnosis present

## 2020-08-28 DIAGNOSIS — O26893 Other specified pregnancy related conditions, third trimester: Secondary | ICD-10-CM | POA: Diagnosis present

## 2020-08-28 DIAGNOSIS — O471 False labor at or after 37 completed weeks of gestation: Secondary | ICD-10-CM

## 2020-08-28 DIAGNOSIS — O9902 Anemia complicating childbirth: Secondary | ICD-10-CM | POA: Diagnosis present

## 2020-08-28 DIAGNOSIS — O99344 Other mental disorders complicating childbirth: Secondary | ICD-10-CM | POA: Diagnosis present

## 2020-08-28 DIAGNOSIS — O479 False labor, unspecified: Secondary | ICD-10-CM

## 2020-08-28 DIAGNOSIS — Z3A37 37 weeks gestation of pregnancy: Secondary | ICD-10-CM | POA: Insufficient documentation

## 2020-08-28 DIAGNOSIS — D649 Anemia, unspecified: Secondary | ICD-10-CM | POA: Diagnosis present

## 2020-08-28 HISTORY — DX: Anxiety disorder, unspecified: F41.9

## 2020-08-28 LAB — POCT FERN TEST: POCT Fern Test: NEGATIVE

## 2020-08-28 LAB — CBC
HCT: 34.1 % — ABNORMAL LOW (ref 36.0–46.0)
Hemoglobin: 10.6 g/dL — ABNORMAL LOW (ref 12.0–15.0)
MCH: 26 pg (ref 26.0–34.0)
MCHC: 31.1 g/dL (ref 30.0–36.0)
MCV: 83.8 fL (ref 80.0–100.0)
Platelets: 215 10*3/uL (ref 150–400)
RBC: 4.07 MIL/uL (ref 3.87–5.11)
RDW: 14.9 % (ref 11.5–15.5)
WBC: 15.6 10*3/uL — ABNORMAL HIGH (ref 4.0–10.5)
nRBC: 0 % (ref 0.0–0.2)

## 2020-08-28 MED ORDER — OXYTOCIN BOLUS FROM INFUSION
333.0000 mL | Freq: Once | INTRAVENOUS | Status: AC
  Administered 2020-08-28: 333 mL via INTRAVENOUS

## 2020-08-28 MED ORDER — FENTANYL-BUPIVACAINE-NACL 0.5-0.125-0.9 MG/250ML-% EP SOLN: 12.0000 mL/h | EPIDURAL | Status: DC | PRN

## 2020-08-28 MED ORDER — ONDANSETRON HCL 4 MG/2ML IJ SOLN: 4.0000 mg | Freq: Four times a day (QID) | INTRAMUSCULAR | Status: DC | PRN

## 2020-08-28 MED ORDER — DIPHENHYDRAMINE HCL 50 MG/ML IJ SOLN: 12.5000 mg | INTRAMUSCULAR | Status: DC | PRN

## 2020-08-28 MED ORDER — OXYTOCIN-SODIUM CHLORIDE 30-0.9 UT/500ML-% IV SOLN
INTRAVENOUS | Status: AC
  Administered 2020-08-29: 2.5 [IU]/h via INTRAVENOUS
  Filled 2020-08-28: qty 500

## 2020-08-28 MED ORDER — LIDOCAINE HCL (PF) 1 % IJ SOLN: 30.0000 mL | INTRAMUSCULAR | Status: DC | PRN

## 2020-08-28 MED ORDER — EPHEDRINE 5 MG/ML INJ: 10.0000 mg | INTRAVENOUS | Status: DC | PRN

## 2020-08-28 MED ORDER — TRANEXAMIC ACID-NACL 1000-0.7 MG/100ML-% IV SOLN
1000.0000 mg | INTRAVENOUS | Status: AC
  Administered 2020-08-28: 1000 mg via INTRAVENOUS

## 2020-08-28 MED ORDER — PHENYLEPHRINE 40 MCG/ML (10ML) SYRINGE FOR IV PUSH (FOR BLOOD PRESSURE SUPPORT): 80.0000 ug | PREFILLED_SYRINGE | INTRAVENOUS | Status: DC | PRN

## 2020-08-28 MED ORDER — LACTATED RINGERS IV SOLN: 500.0000 mL | INTRAVENOUS | Status: DC | PRN

## 2020-08-28 MED ORDER — METHYLERGONOVINE MALEATE 0.2 MG/ML IJ SOLN: 0.2000 mg | Freq: Once | INTRAMUSCULAR | Status: DC

## 2020-08-28 MED ORDER — LACTATED RINGERS IV SOLN: INTRAVENOUS | Status: DC

## 2020-08-28 MED ORDER — ACETAMINOPHEN 325 MG PO TABS: 650.0000 mg | ORAL_TABLET | ORAL | Status: DC | PRN

## 2020-08-28 MED ORDER — TRANEXAMIC ACID-NACL 1000-0.7 MG/100ML-% IV SOLN
INTRAVENOUS | Status: AC
  Filled 2020-08-28: qty 100

## 2020-08-28 MED ORDER — FLEET ENEMA 7-19 GM/118ML RE ENEM: 1.0000 | ENEMA | RECTAL | Status: DC | PRN

## 2020-08-28 MED ORDER — SOD CITRATE-CITRIC ACID 500-334 MG/5ML PO SOLN: 30.0000 mL | ORAL | Status: DC | PRN

## 2020-08-28 MED ORDER — LACTATED RINGERS IV SOLN: 500.0000 mL | Freq: Once | INTRAVENOUS | Status: DC

## 2020-08-28 MED ORDER — OXYTOCIN-SODIUM CHLORIDE 30-0.9 UT/500ML-% IV SOLN: 2.5000 [IU]/h | INTRAVENOUS | Status: DC

## 2020-08-28 MED ORDER — METHYLERGONOVINE MALEATE 0.2 MG/ML IJ SOLN
INTRAMUSCULAR | Status: AC
  Administered 2020-08-28: 0.2 mg
  Filled 2020-08-28: qty 1

## 2020-08-28 MED ORDER — OXYCODONE-ACETAMINOPHEN 5-325 MG PO TABS
2.0000 | ORAL_TABLET | ORAL | Status: DC | PRN
  Administered 2020-08-29: 2 via ORAL
  Filled 2020-08-28: qty 2

## 2020-08-28 MED ORDER — OXYCODONE-ACETAMINOPHEN 5-325 MG PO TABS
1.0000 | ORAL_TABLET | ORAL | Status: DC | PRN
Start: 2020-08-28 — End: 2020-08-29

## 2020-08-28 NOTE — MAU Note (Signed)
Desiree Sherman is a 25 y.o. at [redacted]w[redacted]d here in MAU reporting: cramping since yesterday. Pain is intermittent and comes about every 5 minutes. Saw some bleeding when she wiped today. States seeing clear and red when she wipes so unsure about LOF. +FM  Onset of complaint: yesterday  Pain score: 7/10  Vitals:   08/28/20 1614  BP: 122/72  Pulse: 93  Resp: 16  Temp: 98.9 F (37.2 C)  SpO2: 99%     FHT: +FM  Lab orders placed from triage: none

## 2020-08-28 NOTE — Discharge Instructions (Signed)

## 2020-08-28 NOTE — MAU Note (Signed)
Pt states contractions remain mild and non painful-waiting recheck

## 2020-08-28 NOTE — MAU Note (Signed)
Pt in bathroom - tracing maternal hear rate-

## 2020-08-28 NOTE — MAU Note (Signed)
Fern negative.

## 2020-08-28 NOTE — MAU Note (Signed)
Perineum dry-pt not wearing pad-reports fluid only after urination with wiping. Fern slide collected

## 2020-08-28 NOTE — MAU Provider Note (Addendum)
Chief Complaint  Patient presents with  . Abdominal Pain  . Rupture of Membranes  . Vaginal Bleeding     Event Date/Time   First Provider Initiated Contact with Patient 08/28/20 1712      S: Desiree Sherman  is a 25 y.o. y.o. year old G77P2002 female at [redacted]w[redacted]d weeks gestation who presents to MAU reporting leaking of clear, thick, slightly bloody fluid since this morning. No leaking since then.    Contractions: 6/10, irreg Vaginal bleeding: Scant Fetal movement: Nml  States cervical exam was 1 cm dilated at last check in office.   O:  Patient Vitals for the past 24 hrs:  BP Temp Temp src Pulse Resp SpO2 Height Weight  08/28/20 1655 - - - - - 100 % - -  08/28/20 1645 - - - - - 99 % - -  08/28/20 1644 128/69 - - 99 - - - -  08/28/20 1640 - - - - - 100 % - -  08/28/20 1635 - - - - - 99 % - -  08/28/20 1632 130/77 - - 95 17 - - -  08/28/20 1614 122/72 98.9 F (37.2 C) Oral 93 16 99 % - -  08/28/20 1610 - - - - - - 5\' 5"  (1.651 m) 86.6 kg   General: NAD Heart: Regular rate Lungs: Normal rate and effort Abd: Soft, NT, Gravid, S=D Pelvic: NEFG, Neg pooling, scant blood.  EFM: 135, Moderate variability, 15 x 15 accelerations, no decelerations Toco: irreg, mild-mod Neg [facility] of pt turned over to [facility], CNM at 1800.  [employer], Katrinka Blazing, CNM 08/28/2020 5:13 PM  1830: SVE 5/60/-2, ctx irregular on toco, pt reports no worsening 1930: SVE 5/60/-2, rare ctx on toco, pt reports no change, appears comfortable. Dr. 08/30/2020 notified and plan for d/c home.   A/P: 1. False labor    Discharge home Follow up at [facility] this week as scheduled Labor precautions  Allergies as of 08/28/2020   No Known Allergies     Medication List    STOP taking these medications   ibuprofen 600 MG tablet Commonly known as: ADVIL     TAKE these medications   amoxicillin 500 MG capsule Commonly known as: AMOXIL Take 1 capsule (500 mg total) by mouth 3 (three) times  daily.   prenatal multivitamin Tabs tablet Take 1 tablet by mouth daily at 12 noon.   sertraline 50 MG tablet Commonly known as: ZOLOFT Take 1 tablet (50 mg total) by mouth daily.      08/30/2020, CNM  08/28/2020 8:02 PM

## 2020-08-28 NOTE — MAU Note (Signed)
PT SAYS FEELS UC - STRONG SINCE LEFT HERE- 740PM-    WAS  5 CM.  DENIES HSV AND RMSA. GBS- NEG

## 2020-08-28 NOTE — MAU Note (Signed)
Pt returned from bathroom-cardio repositioned

## 2020-08-28 NOTE — MAU Note (Signed)
RN arrived in room around 10:40 pm to find pt pacing and complaining of strong, constant ctx.  Fetal heart tones were obtained via doppler at 151 bpm, cervical check was performed and the patient was found to be 10 cm dilated, 100% effaced, fetus at 0 station.  Dr. Reina Fuse and Faculty Practice providers were notified of patient cervical exam, and the patient was transported immediately via stretcher to Labor and Delivery floor.  Bedside handoff was given to RN staff.

## 2020-08-28 NOTE — MAU Note (Signed)
Pt in bathroom - tracing maternal HR on cardio

## 2020-08-28 NOTE — H&P (Signed)
Desiree Sherman is a 25 y.o. female presenting for painful contractions. Seen earlier in MAU, contracting rarely and unchanged at 5/60/-2, sent home. +FM, denies VB, LOF. CTX worsened around 2030. Baby girl, GBS neg  PNC c/b anemia on PO iron.  OB History    Gravida  3   Para  2   Term  2   Preterm      AB      Living  2     SAB      IAB      Ectopic      Multiple  0   Live Births  2          Past Medical History:  Diagnosis Date  . Anxiety   . Chlamydia    during pregnancy  . Depression    Past Surgical History:  Procedure Laterality Date  . CHEST SURGERY     fell onto glass table, glass removed   Family History: family history includes Asthma in her mother. Social History:  reports that she has never smoked. She has never used smokeless tobacco. She reports that she does not drink alcohol and does not use drugs.     Maternal Diabetes: No1hr 118 Genetic Screening: Normal Maternal Ultrasounds/Referrals: Normal Fetal Ultrasounds or other Referrals:  None Maternal Substance Abuse:  No Significant Maternal Medications:  None Significant Maternal Lab Results:  Group B Strep negative Other Comments:  None  Review of Systems  Constitutional: Negative for chills and fever.  Respiratory: Negative for shortness of breath.   Cardiovascular: Negative for chest pain, palpitations and leg swelling.  Gastrointestinal: Positive for abdominal pain. Negative for vomiting.  Neurological: Negative for dizziness, weakness and headaches.  Psychiatric/Behavioral: Negative for suicidal ideas.   Maternal Medical History:  Reason for admission: Contractions.   Contractions: Onset was 1-2 hours ago.   Frequency: regular.   Perceived severity is strong.    Fetal activity: Perceived fetal activity is normal.    Prenatal complications: No bleeding.   Prenatal Complications - Diabetes: none.    Dilation: 10 Effacement (%): 100 Station: Plus 1 Blood  pressure (!) 121/59, temperature 98.1 F (36.7 C), temperature source Oral, resp. rate 20, height 5\' 5"  (1.651 m), weight 85.7 kg, SpO2 100 %, unknown if currently breastfeeding. Exam Physical Exam Constitutional:      General: She is not in acute distress.    Appearance: She is well-developed and well-nourished.  HENT:     Head: Normocephalic and atraumatic.  Eyes:     Pupils: Pupils are equal, round, and reactive to light.  Cardiovascular:     Rate and Rhythm: Normal rate and regular rhythm.     Heart sounds: No murmur heard. No gallop.   Abdominal:     Tenderness: There is no abdominal tenderness. There is no guarding or rebound.  Genitourinary:    Vagina: Normal.     Uterus: Normal.   Musculoskeletal:        General: Normal range of motion.     Cervical back: Normal range of motion and neck supple.  Skin:    General: Skin is warm and dry.  Neurological:     Mental Status: She is alert and oriented to person, place, and time.     CE complete with bulging bag  Prenatal labs: ABO, Rh: --/--/PENDING (02/13 2311)oPOS Antibody: PENDING (02/13 2311)NEG Rubella:  IMM RPR:   NR HBsAg:   neg HIV:   nr GBS:   neg  Assessment/Plan: This  is a 25yo G3P2002 @ 37 5/7 by 9wk scan presenting in active labor, GBS neg, baby girl. Found to have bulging bag and urge to push on admission from MAU. Clear AROM at 2255, pleae see delivery note rapidly afterwards  Carlisle Cater 08/28/2020, 11:55 PM

## 2020-08-28 NOTE — MAU Note (Signed)
Repeat fern negative per CNM-plan to repeat cervical exam and observe fetal tracing

## 2020-08-28 NOTE — H&P (Incomplete)
Desiree Sherman is a 25 y.o. female presenting for painful contractions. Seen earlier in [facility], contracting rarely and unchanged at 5/60/-2, sent home. +FM, denies VB, LOF. CTX worsened around 2030. Baby girl, GBS neg  PNC c/b anemia on PO iron.  OB History    Gravida  3   Para  2   Term  2   Preterm      AB      Living  2     SAB      IAB      Ectopic      Multiple  0   Live Births  2          Past Medical History:  Diagnosis Date  . Anxiety   . Chlamydia    during pregnancy  . Depression    Past Surgical History:  Procedure Laterality Date  . CHEST SURGERY     fell onto glass table, glass removed   Family History: family history includes Asthma in her mother. Social History:  reports that she has never smoked. She has never used smokeless tobacco. She reports that she does not drink alcohol and does not use drugs.     Maternal Diabetes: No1hr 118 Genetic Screening: Normal Maternal Ultrasounds/Referrals: Normal Fetal Ultrasounds or other Referrals:  None Maternal Substance Abuse:  No Significant Maternal Medications:  None Significant Maternal Lab Results:  Group B Strep negative Other Comments:  None  Review of Systems  Constitutional: Negative for chills and fever.  Respiratory: Negative for shortness of breath.   Cardiovascular: Negative for chest pain, palpitations and leg swelling.  Gastrointestinal: Positive for abdominal pain. Negative for vomiting.  Neurological: Negative for dizziness, weakness and headaches.  Psychiatric/Behavioral: Negative for suicidal ideas.   Maternal Medical History:  Reason for admission: Contractions.   Contractions: Onset was 1-2 hours ago.   Frequency: regular.   Perceived severity is strong.    Fetal activity: Perceived fetal activity is normal.    Prenatal complications: No bleeding.   Prenatal Complications - Diabetes: none.    Dilation: 10 Effacement (%): 100 Station: Plus 1 Blood  pressure (!) 121/59, temperature 98.1 F (36.7 C), temperature source Oral, resp. rate 20, height 5\' 5"  (1.651 m), weight 85.7 kg, SpO2 100 %, unknown if currently breastfeeding. Exam Physical Exam Constitutional:      General: She is not in acute distress.    Appearance: She is well-developed and well-nourished.  HENT:     Head: Normocephalic and atraumatic.  Eyes:     Pupils: Pupils are equal, round, and reactive to light.  Cardiovascular:     Rate and Rhythm: Normal rate and regular rhythm.     Heart sounds: No murmur heard. No gallop.   Abdominal:     Tenderness: There is no abdominal tenderness. There is no guarding or rebound.  Genitourinary:    Vagina: Normal.     Uterus: Normal.   Musculoskeletal:        General: Normal range of motion.     Cervical back: Normal range of motion and neck supple.  Skin:    General: Skin is warm and dry.  Neurological:     Mental Status: She is alert and oriented to person, place, and time.     CE complete with bulging bag  Prenatal labs: ABO, Rh: --/--/PENDING (02/13 2311)oPOS Antibody: PENDING (02/13 2311)NEG Rubella:  IMM RPR:   NR HBsAg:   neg HIV:   nr GBS:   neg  Assessment/Plan: This  is a 24yo    Carlisle Cater 08/28/2020, 11:55 PM

## 2020-08-29 ENCOUNTER — Encounter (HOSPITAL_COMMUNITY): Payer: Self-pay | Admitting: Obstetrics and Gynecology

## 2020-08-29 LAB — RESP PANEL BY RT-PCR (FLU A&B, COVID) ARPGX2
Influenza A by PCR: NEGATIVE
Influenza B by PCR: NEGATIVE
SARS Coronavirus 2 by RT PCR: NEGATIVE

## 2020-08-29 LAB — TYPE AND SCREEN
ABO/RH(D): O POS
Antibody Screen: NEGATIVE

## 2020-08-29 LAB — CBC
HCT: 31.3 % — ABNORMAL LOW (ref 36.0–46.0)
Hemoglobin: 10.3 g/dL — ABNORMAL LOW (ref 12.0–15.0)
MCH: 27 pg (ref 26.0–34.0)
MCHC: 32.9 g/dL (ref 30.0–36.0)
MCV: 82.2 fL (ref 80.0–100.0)
Platelets: 215 10*3/uL (ref 150–400)
RBC: 3.81 MIL/uL — ABNORMAL LOW (ref 3.87–5.11)
RDW: 14.9 % (ref 11.5–15.5)
WBC: 18.2 10*3/uL — ABNORMAL HIGH (ref 4.0–10.5)
nRBC: 0 % (ref 0.0–0.2)

## 2020-08-29 LAB — RPR: RPR Ser Ql: NONREACTIVE

## 2020-08-29 MED ORDER — PRENATAL MULTIVITAMIN CH
1.0000 | ORAL_TABLET | Freq: Every day | ORAL | Status: DC
  Administered 2020-08-29 – 2020-08-30 (×2): 1 via ORAL
  Filled 2020-08-29 (×2): qty 1

## 2020-08-29 MED ORDER — DIPHENHYDRAMINE HCL 25 MG PO CAPS: 25.0000 mg | ORAL_CAPSULE | Freq: Four times a day (QID) | ORAL | Status: DC | PRN

## 2020-08-29 MED ORDER — SIMETHICONE 80 MG PO CHEW: 80.0000 mg | CHEWABLE_TABLET | ORAL | Status: DC | PRN

## 2020-08-29 MED ORDER — DIBUCAINE (PERIANAL) 1 % EX OINT: 1.0000 "application " | TOPICAL_OINTMENT | CUTANEOUS | Status: DC | PRN

## 2020-08-29 MED ORDER — BENZOCAINE-MENTHOL 20-0.5 % EX AERO: 1.0000 "application " | INHALATION_SPRAY | CUTANEOUS | Status: DC | PRN

## 2020-08-29 MED ORDER — SENNOSIDES-DOCUSATE SODIUM 8.6-50 MG PO TABS
2.0000 | ORAL_TABLET | Freq: Every day | ORAL | Status: DC
  Administered 2020-08-30: 2 via ORAL
  Filled 2020-08-29: qty 2

## 2020-08-29 MED ORDER — IBUPROFEN 600 MG PO TABS
600.0000 mg | ORAL_TABLET | Freq: Four times a day (QID) | ORAL | Status: DC
  Administered 2020-08-29 – 2020-08-30 (×6): 600 mg via ORAL
  Filled 2020-08-29 (×6): qty 1

## 2020-08-29 MED ORDER — TETANUS-DIPHTH-ACELL PERTUSSIS 5-2.5-18.5 LF-MCG/0.5 IM SUSY: 0.5000 mL | PREFILLED_SYRINGE | Freq: Once | INTRAMUSCULAR | Status: DC

## 2020-08-29 MED ORDER — ACETAMINOPHEN 325 MG PO TABS: 650.0000 mg | ORAL_TABLET | ORAL | Status: DC | PRN

## 2020-08-29 MED ORDER — ONDANSETRON HCL 4 MG PO TABS
4.0000 mg | ORAL_TABLET | ORAL | Status: DC | PRN
Start: 2020-08-29 — End: 2020-08-30

## 2020-08-29 MED ORDER — COCONUT OIL OIL: 1.0000 "application " | TOPICAL_OIL | Status: DC | PRN

## 2020-08-29 MED ORDER — ZOLPIDEM TARTRATE 5 MG PO TABS: 5.0000 mg | ORAL_TABLET | Freq: Every evening | ORAL | Status: DC | PRN

## 2020-08-29 MED ORDER — ONDANSETRON HCL 4 MG/2ML IJ SOLN: 4.0000 mg | INTRAMUSCULAR | Status: DC | PRN

## 2020-08-29 MED ORDER — WITCH HAZEL-GLYCERIN EX PADS: 1.0000 "application " | MEDICATED_PAD | CUTANEOUS | Status: DC | PRN

## 2020-08-29 NOTE — Social Work (Signed)
CSW received consult for hx of marijuana use & hx of depression.  Referral was screened out due to the following:  ~MOB had no documented substance use after initial prenatal visit/+UPT. ~MOB had no positive drug screens after initial prenatal visit/+UPT.  * Referral for depression screened out by Clinical Social Worker because none of the following criteria appear to apply: ~ History of anxiety/depression during this pregnancy, or of post-partum depression. ~ Diagnosis of anxiety and/or depression within last 3 years. OR * MOB's symptoms currently being treated with medication and/or therapy.  Please contact the Clinical Social Worker if needs arise, by MOB request, or if MOB scores greater than 9/yes to question 10 on Edinburgh Postpartum Depression Screen.  Nena Hampe Tess, LCSWA Clinical Social Work [facility]  [phone] 

## 2020-08-29 NOTE — Progress Notes (Signed)
Patient ID: Desiree Sherman, female   DOB: 1996/03/24, 25 y.o.   MRN: [id number] Pt doing well with no complaints. She denies HA, CP, SOB or lightheadedness. Pain well controlled, voiding well. Bonding well with baby.  VSS GEN - NAD ABD - FF and 1cm below umbilicus EXT - no homans   18.2>10.3<215  A/P: PPD#1 s'p precip delivery - stable          Routine pp care         Likely discharge to home tomorrow

## 2020-08-30 MED ORDER — IBUPROFEN 600 MG PO TABS: 600.0000 mg | ORAL_TABLET | Freq: Four times a day (QID) | ORAL | 0 refills | Status: DC

## 2020-08-30 NOTE — Progress Notes (Signed)
PPD #2 Doing well Afeb, VSS D/c home 

## 2020-08-30 NOTE — Discharge Summary (Signed)
Postpartum Discharge Summary      Patient Name: Desiree Sherman DOB: [birth year] MRN: [id number]  Date of admission: 08/28/2020 Delivery date:08/28/2020  Delivering provider: Carlisle Cater  Date of discharge: 08/30/2020  Admitting diagnosis: Pregnancy [Z34.90] Intrauterine pregnancy: [redacted]w[redacted]d     Secondary diagnosis:  Active Problems:   Pregnancy      Discharge diagnosis: Term Pregnancy Delivered                                              Hospital course: Onset of Labor With Vaginal Delivery      25 y.o. yo G3P3003 at [redacted]w[redacted]d was admitted in Active Labor on 08/28/2020. Patient had an uncomplicated labor course as follows:  Membrane Rupture Time/Date: 10:57 PM ,08/28/2020   Delivery Method:Vaginal, Spontaneous  Episiotomy: None  Lacerations:  None  Patient had an uncomplicated postpartum course.  She is ambulating, tolerating a regular diet, passing flatus, and urinating well. Patient is discharged home in stable condition on 08/30/20.  Newborn Data: Birth date:08/28/2020  Birth time:11:38 PM  Gender:Female  Living status:Living  Apgars:9 ,9  Weight:2750 g   Physical exam  Vitals:   08/29/20 0630 08/29/20 1020 08/29/20 1410 08/29/20 2102  BP: 118/72 100/63 100/65 110/72  Pulse: 72 74 67 74  Resp: 18 16 16 18   Temp: (!) 97.5 F (36.4 C) 97.9 F (36.6 C) 97.8 F (36.6 C) 98.2 F (36.8 C)  TempSrc: Oral Oral Oral Oral  SpO2: 99% 99% 100% 100%  Weight:      Height:       General: alert Lochia: appropriate Uterine Fundus: firm  Labs: Lab Results  Component Value Date   WBC 18.2 (H) 08/29/2020   HGB 10.3 (L) 08/29/2020   HCT 31.3 (L) 08/29/2020   MCV 82.2 08/29/2020   PLT 215 08/29/2020   No flowsheet data found. Edinburgh Score: Edinburgh Postnatal Depression Scale Screening Tool 08/29/2020  I have been able to laugh and see the funny side of things. (No Data)  I have looked forward with enjoyment to things. -  I have blamed myself unnecessarily  when things went wrong. -  I have been anxious or worried for no good reason. -  I have felt scared or panicky for no good reason. -  Things have been getting on top of me. -  I have been so unhappy that I have had difficulty sleeping. -  I have felt sad or miserable. -  I have been so unhappy that I have been crying. -  The thought of harming myself has occurred to me. 08/31/2020 Postnatal Depression Scale Total -      After visit meds:  Allergies as of 08/30/2020   No Known Allergies     Medication List    TAKE these medications   amoxicillin 500 MG capsule Commonly known as: AMOXIL Take 1 capsule (500 mg total) by mouth 3 (three) times daily.   ibuprofen 600 MG tablet Commonly known as: ADVIL Take 1 tablet (600 mg total) by mouth every 6 (six) hours.   prenatal multivitamin Tabs tablet Take 1 tablet by mouth daily at 12 noon.   sertraline 50 MG tablet Commonly known as: ZOLOFT Take 1 tablet (50 mg total) by mouth daily.        Discharge home in stable condition Infant Feeding: Bottle Infant Disposition:home  with mother Discharge instruction: per After Visit Summary and Postpartum booklet. Activity: Advance as tolerated. Pelvic rest for 6 weeks.  Diet: routine diet  Postpartum Appointment:6 weeks Follow up Visit:  Follow-up Information    Shivaji, Valerie Roys, MD. Schedule an appointment as soon as possible for a visit in 6 week(s).   Specialty: Obstetrics and Gynecology Contact information: [address] [place] [address] [phone]                   08/30/2020 Zenaida Niece, MD

## 2020-08-30 NOTE — Discharge Instructions (Signed)
As per discharge pamphlet °

## 2021-07-16 NOTE — L&D Delivery Note (Signed)
LABOR COURSE ?CNM presented to bedside at [facility] following call for imminent delivery from [facility] RN and [facility] MD.  ? ?Delivery Note ?Patient was complete and pushing. Head delivered ROT. Loose nuchal cord present, reduced at perineum. Shoulder and body delivered in usual fashion. At 1910 a viable female was delivered via Vaginal, Spontaneous (Presentation:ROT;  ROP).  Infant with spontaneous cry, placed on mother's abdomen, dried and stimulated. Cord clamped x 2 after one-minute delay, and cut by RN. Cord blood drawn. Multiple Placenta delivered spontaneously with gentle cord traction. Appears intact. Fundus firm with massage and Pitocin, Cytotec and TXA. Labia, perineum, vagina, and cervix inspected.  ? ?APGAR:9, 9; weight: pending.   ?Cord pH: Not indicated ? ?Anesthesia: N/A  ?Episiotomy: None ?Lacerations: Intact ?Est. Blood Loss (mL): 250 ? ?Mom to [facility] postpartum.  Baby to Couplet care / Skin to Skin. ? ?Clayton Bibles, CNM ?10/29/21 ?8:26 PM   ? ?

## 2021-10-29 ENCOUNTER — Other Ambulatory Visit: Payer: Self-pay

## 2021-10-29 ENCOUNTER — Emergency Department (HOSPITAL_COMMUNITY): Payer: Medicaid Other

## 2021-10-29 ENCOUNTER — Inpatient Hospital Stay (HOSPITAL_COMMUNITY)
Admission: AD | Admit: 2021-10-29 | Discharge: 2021-10-31 | DRG: 807 | Disposition: A | Discharge status: Home / Self Care | Payer: Medicaid Other | Attending: Obstetrics and Gynecology | Admitting: Obstetrics and Gynecology

## 2021-10-29 ENCOUNTER — Encounter (HOSPITAL_COMMUNITY): Payer: Self-pay | Admitting: Emergency Medicine

## 2021-10-29 DIAGNOSIS — Z3A36 36 weeks gestation of pregnancy: Secondary | ICD-10-CM | POA: Diagnosis not present

## 2021-10-29 DIAGNOSIS — Z3A00 Weeks of gestation of pregnancy not specified: Secondary | ICD-10-CM | POA: Diagnosis not present

## 2021-10-29 LAB — I-STAT BETA HCG BLOOD, ED (MC, WL, AP ONLY): I-stat hCG, quantitative: >2000 m[IU]/mL — ABNORMAL HIGH (ref <5)

## 2021-10-29 LAB — COMPREHENSIVE METABOLIC PANEL
ALT: 12 U/L (ref 0–44)
AST: 18 U/L (ref 15–41)
Albumin: 3.1 g/dL — ABNORMAL LOW (ref 3.5–5.0)
Alkaline Phosphatase: 140 U/L — ABNORMAL HIGH (ref 38–126)
Anion gap: 9 (ref 5–15)
BUN: 7 mg/dL (ref 6–20)
CO2: 17 mmol/L — ABNORMAL LOW (ref 22–32)
Calcium: 8.9 mg/dL (ref 8.9–10.3)
Chloride: 110 mmol/L (ref 98–111)
Creatinine, Ser: 0.51 mg/dL (ref 0.44–1.00)
GFR, Estimated: >60 mL/min (ref >60)
Glucose, Bld: 90 mg/dL (ref 70–99)
Potassium: 3.7 mmol/L (ref 3.5–5.1)
Sodium: 136 mmol/L (ref 135–145)
Total Bilirubin: 0.4 mg/dL (ref 0.3–1.2)
Total Protein: 7.1 g/dL (ref 6.5–8.1)

## 2021-10-29 LAB — RAPID HIV SCREEN (HIV 1/2 AB+AG)
HIV 1/2 Antibodies: NONREACTIVE
HIV-1 P24 Antigen - HIV24: NONREACTIVE

## 2021-10-29 LAB — DIFFERENTIAL
Abs Immature Granulocytes: 0.21 10*3/uL — ABNORMAL HIGH (ref 0.00–0.07)
Basophils Absolute: 0 10*3/uL (ref 0.0–0.1)
Basophils Relative: 0 %
Eosinophils Absolute: 0 10*3/uL (ref 0.0–0.5)
Eosinophils Relative: 0 %
Immature Granulocytes: 1 %
Lymphocytes Relative: 10 %
Lymphs Abs: 1.5 10*3/uL (ref 0.7–4.0)
Monocytes Absolute: 0.7 10*3/uL (ref 0.1–1.0)
Monocytes Relative: 5 %
Neutro Abs: 12.5 10*3/uL — ABNORMAL HIGH (ref 1.7–7.7)
Neutrophils Relative %: 84 %

## 2021-10-29 LAB — TYPE AND SCREEN
ABO/RH(D): O POS
Antibody Screen: NEGATIVE

## 2021-10-29 LAB — CBC
HCT: 35 % — ABNORMAL LOW (ref 36.0–46.0)
Hemoglobin: 10.9 g/dL — ABNORMAL LOW (ref 12.0–15.0)
MCH: 24.8 pg — ABNORMAL LOW (ref 26.0–34.0)
MCHC: 31.1 g/dL (ref 30.0–36.0)
MCV: 79.5 fL — ABNORMAL LOW (ref 80.0–100.0)
Platelets: 229 10*3/uL (ref 150–400)
RBC: 4.4 MIL/uL (ref 3.87–5.11)
RDW: 16.1 % — ABNORMAL HIGH (ref 11.5–15.5)
WBC: 14.8 10*3/uL — ABNORMAL HIGH (ref 4.0–10.5)
nRBC: 0 % (ref 0.0–0.2)

## 2021-10-29 LAB — LIPASE, BLOOD: Lipase: 23 U/L (ref 11–51)

## 2021-10-29 LAB — HEPATITIS B SURFACE ANTIGEN: Hepatitis B Surface Ag: NONREACTIVE

## 2021-10-29 MED ORDER — DIBUCAINE (PERIANAL) 1 % EX OINT: 1.0000 "application " | TOPICAL_OINTMENT | CUTANEOUS | Status: DC | PRN

## 2021-10-29 MED ORDER — PRENATAL MULTIVITAMIN CH
1.0000 | ORAL_TABLET | Freq: Every day | ORAL | Status: DC
  Administered 2021-10-30 – 2021-10-31 (×2): 1 via ORAL
  Filled 2021-10-29 (×2): qty 1

## 2021-10-29 MED ORDER — BENZOCAINE-MENTHOL 20-0.5 % EX AERO: 1.0000 "application " | INHALATION_SPRAY | CUTANEOUS | Status: DC | PRN

## 2021-10-29 MED ORDER — ONDANSETRON HCL 4 MG/2ML IJ SOLN: 4.0000 mg | Freq: Once | INTRAMUSCULAR | Status: DC

## 2021-10-29 MED ORDER — TRANEXAMIC ACID-NACL 1000-0.7 MG/100ML-% IV SOLN
1000.0000 mg | INTRAVENOUS | Status: AC
  Administered 2021-10-29: 1000 mg via INTRAVENOUS
  Filled 2021-10-29: qty 100

## 2021-10-29 MED ORDER — FENTANYL CITRATE PF 50 MCG/ML IJ SOSY: 50.0000 ug | PREFILLED_SYRINGE | Freq: Once | INTRAMUSCULAR | Status: DC

## 2021-10-29 MED ORDER — AZITHROMYCIN 500 MG PO TABS
1000.0000 mg | ORAL_TABLET | Freq: Once | ORAL | Status: DC
  Filled 2021-10-29: qty 4

## 2021-10-29 MED ORDER — ACETAMINOPHEN 325 MG PO TABS
650.0000 mg | ORAL_TABLET | ORAL | Status: DC | PRN
  Administered 2021-10-30 – 2021-10-31 (×4): 650 mg via ORAL
  Filled 2021-10-29 (×4): qty 2

## 2021-10-29 MED ORDER — ONDANSETRON HCL 4 MG/2ML IJ SOLN: 4.0000 mg | INTRAMUSCULAR | Status: DC | PRN

## 2021-10-29 MED ORDER — FENTANYL CITRATE PF 50 MCG/ML IJ SOSY
PREFILLED_SYRINGE | INTRAMUSCULAR | Status: AC
  Administered 2021-10-29: 50 ug
  Filled 2021-10-29: qty 2

## 2021-10-29 MED ORDER — WITCH HAZEL-GLYCERIN EX PADS: 1.0000 "application " | MEDICATED_PAD | CUTANEOUS | Status: DC | PRN

## 2021-10-29 MED ORDER — SIMETHICONE 80 MG PO CHEW: 80.0000 mg | CHEWABLE_TABLET | ORAL | Status: DC | PRN

## 2021-10-29 MED ORDER — DIPHENHYDRAMINE HCL 25 MG PO CAPS: 25.0000 mg | ORAL_CAPSULE | Freq: Four times a day (QID) | ORAL | Status: DC | PRN

## 2021-10-29 MED ORDER — MISOPROSTOL 200 MCG PO TABS
1000.0000 ug | ORAL_TABLET | Freq: Once | ORAL | Status: AC
  Administered 2021-10-29: 1000 ug via RECTAL

## 2021-10-29 MED ORDER — ONDANSETRON HCL 4 MG PO TABS: 4.0000 mg | ORAL_TABLET | ORAL | Status: DC | PRN

## 2021-10-29 MED ORDER — COCONUT OIL OIL: 1.0000 "application " | TOPICAL_OIL | Status: DC | PRN

## 2021-10-29 MED ORDER — OXYTOCIN-SODIUM CHLORIDE 30-0.9 UT/500ML-% IV SOLN
10.0000 [IU]/h | INTRAVENOUS | Status: DC
  Filled 2021-10-29: qty 500

## 2021-10-29 MED ORDER — LACTATED RINGERS IV BOLUS
1000.0000 mL | Freq: Once | INTRAVENOUS | Status: AC
  Administered 2021-10-29: 1000 mL via INTRAVENOUS

## 2021-10-29 MED ORDER — SODIUM CHLORIDE 0.9 % IV SOLN
10.0000 [IU]/h | INTRAVENOUS | Status: DC
  Administered 2021-10-29: 10 [IU]/h via INTRAVENOUS
  Filled 2021-10-29 (×2): qty 3

## 2021-10-29 MED ORDER — MEASLES, MUMPS & RUBELLA VAC IJ SOLR: 0.5000 mL | Freq: Once | INTRAMUSCULAR | Status: DC

## 2021-10-29 MED ORDER — IBUPROFEN 600 MG PO TABS
600.0000 mg | ORAL_TABLET | Freq: Four times a day (QID) | ORAL | Status: DC
  Administered 2021-10-29 – 2021-10-31 (×7): 600 mg via ORAL
  Filled 2021-10-29 (×7): qty 1

## 2021-10-29 MED ORDER — TETANUS-DIPHTH-ACELL PERTUSSIS 5-2.5-18.5 LF-MCG/0.5 IM SUSY: 0.5000 mL | PREFILLED_SYRINGE | Freq: Once | INTRAMUSCULAR | Status: DC

## 2021-10-29 MED ORDER — AMPICILLIN SODIUM 2 G IJ SOLR
2.0000 g | Freq: Four times a day (QID) | INTRAMUSCULAR | Status: DC
  Administered 2021-10-29: 2 g via INTRAVENOUS
  Filled 2021-10-29 (×3): qty 2000

## 2021-10-29 MED ORDER — SENNOSIDES-DOCUSATE SODIUM 8.6-50 MG PO TABS
2.0000 | ORAL_TABLET | Freq: Every day | ORAL | Status: DC
  Administered 2021-10-30 – 2021-10-31 (×2): 2 via ORAL
  Filled 2021-10-29 (×2): qty 2

## 2021-10-29 MED ORDER — AMOXICILLIN 500 MG PO CAPS: 500.0000 mg | ORAL_CAPSULE | Freq: Three times a day (TID) | ORAL | Status: DC

## 2021-10-29 NOTE — Discharge Summary (Signed)
? ?  Postpartum Discharge Summary ? ? ?   ?Patient Name: Desiree Sherman ?DOB: 08-04-1995 ?MRN: [id number] ? ?Date of admission: 10/29/2021 ?Delivery date:10/29/2021  ?Delivering provider: Darlina Rumpf  ?Date of discharge: 10/31/2021 ? ?Admitting diagnosis: Precipitous delivery [O62.3] ?Vaginal delivery [O80] ?Intrauterine pregnancy: Unknown     ?Secondary diagnosis:  Principal Problem: ?  Vaginal delivery ? ?Additional problems: No prenatal care    ?Discharge diagnosis: Preterm Pregnancy Delivered                                              ?Post partum procedures:none ?Augmentation: none ?Complications: precip delivery at [facility] ? ?Hospital course: Onset of Labor With Vaginal Delivery      ?26 y.o. yo P3P6886 at Unknown was admitted in Active Labor on 10/29/2021. Patient had an uncomplicated labor course as follows:  ?Membrane Rupture Time/Date:  ,   ?Delivery Method:Vaginal, Spontaneous  ?Episiotomy: None  ?Lacerations:  None  ?Patient had an uncomplicated postpartum course.  She is ambulating, tolerating a regular diet, passing flatus, and urinating well. Patient is discharged home in stable condition on 10/31/21. ? ?Newborn Data: ?Birth date:10/29/2021  ?Birth time:7:10 PM  ?Gender:Female  ?Living status:Living  ?Apgars:9 ,9  ?Weight:2750 g  ? ?Magnesium Sulfate received: No ?BMZ received: No ?Rhophylac:N/A ?MMR:N/A ?T-DaP: no ?Flu: No ?Transfusion:No ? ?Physical exam  ?Vitals:  ? 10/30/21 1648 10/30/21 1743 10/30/21 2057 10/31/21 4847  ?BP: 123/72  123/72 119/77  ?Pulse: 72  68 74  ?Resp: _0 ?Temp:  98 ?F (36.7 ?C) 97.8 ?F (36.6 ?C) 97.9 ?F (36.6 ?C)  ?TempSrc:  Oral Axillary Oral  ?SpO2: 100%  97% 100%  ? ?General: alert, cooperative, and no distress ?Lochia: appropriate ?Uterine Fundus: firm ?Incision: N/A ?DVT Evaluation: No evidence of DVT seen on physical exam. ?Negative Homan's sign. ?No cords or calf tenderness. ?Labs: ?Lab Results  ?Component Value Date  ? WBC 17.3 (H) 10/30/2021  ? HGB  9.4 (L) 10/30/2021  ? HCT 29.8 (L) 10/30/2021  ? MCV 78.4 (L) 10/30/2021  ? PLT 186 10/30/2021  ? ? ?  Latest Ref Rng & Units 10/29/2021  ?  5:39 PM  ?CMP  ?Glucose 70 - 99 mg/dL 90    ?BUN 6 - 20 mg/dL 7    ?Creatinine 0.44 - 1.00 mg/dL 0.51    ?Sodium 135 - 145 mmol/L 136    ?Potassium 3.5 - 5.1 mmol/L 3.7    ?Chloride 98 - 111 mmol/L 110    ?CO2 22 - 32 mmol/L 17    ?Calcium 8.9 - 10.3 mg/dL 8.9    ?Total Protein 6.5 - 8.1 g/dL 7.1    ?Total Bilirubin 0.3 - 1.2 mg/dL 0.4    ?Alkaline Phos 38 - 126 U/L 140    ?AST 15 - 41 U/L 18    ?ALT 0 - 44 U/L 12    ? ?Edinburgh Score: ? ?  10/30/2021  ?  5:44 PM  ?Flavia Shipper Postnatal Depression Scale Screening Tool  ?I have been able to laugh and see the funny side of things. 0  ?I have looked forward with enjoyment to things. 0  ?I have blamed myself unnecessarily when things went wrong. 1  ?I have been anxious or worried for no good reason. 0  ?I have felt scared or panicky for no good reason. 0  ?Things  have been getting on top of me. 2  ?I have been so unhappy that I have had difficulty sleeping. 1  ?I have felt sad or miserable. 0  ?I have been so unhappy that I have been crying. 0  ?The thought of harming myself has occurred to me. 0  ?Edinburgh Postnatal Depression Scale Total 4  ? ? ? ?After visit meds:  ?Allergies as of 10/31/2021   ?No Known Allergies ?  ? ?  ?Medication List  ?  ? ?STOP taking these medications   ? ?acetaminophen 500 MG tablet ?Commonly known as: TYLENOL ?  ? ?  ? ?TAKE these medications   ? ?ibuprofen 600 MG tablet ?Commonly known as: ADVIL ?Take 1 tablet (600 mg total) by mouth every 6 (six) hours. ?  ? ?  ? ? ? ?Discharge home in stable condition ?Infant Feeding: Bottle ?Infant Disposition:home with mother, plans private adoption w/FOB parents ?Discharge instruction: per After Visit Summary and Postpartum booklet. ?Activity: Advance as tolerated. Pelvic rest for 6 weeks.  ?Diet: routine diet ?Future Appointments: ?Future Appointments  ?Date Time  Provider [place]  ?11/30/2021  2:55 PM Aletha Halim, MD [facility]  ? ?Follow up Visit: ? Follow-up Information   ? ? Center for [employer] at [facility]. Schedule an appointment as soon as possible for a visit in 1 week(s).   ?Specialty: Obstetrics and Gynecology ?Why: for a mood check and 4 weeks for postpartum checkup ?Contact information: ?[place] ?[place] [phone] ?[phone] ? ?  ?  ? ?  ?  ? ?  ? Message sent to [facility] by Maryelizabeth Kaufmann on 10/29/2021 ? ? ?Please schedule this patient for a In person postpartum visit in 4 weeks with the following provider: Any provider. ?Additional Postpartum F/U:Postpartum Depression checkup  ?Low risk pregnancy complicated by:  no prenatal care ?Delivery mode:  Vaginal, Spontaneous  ?Anticipated Birth Control:  Unsure ? ? ?10/31/2021 ?Christin Fudge, CNM ? ? ? ?

## 2021-10-29 NOTE — Consult Note (Signed)
? ?  NEONATAL DELIVERY CONSULTATION  ? ?Delivery Note         10/29/2021  8:06 PM ? ?DATE BIRTH/Time:  08/10/1995   ?NAME:   Desiree Sherman ?  ?MRN:    [id number] ?ACCOUNT NUMBER:    [id number] ? ?BIRTH DATE/Time:  09/06/1995   ? ?ATTEND REQ BY:  [facility] ?REASON FOR ATTEND: Limited PNC, EGA 36w  ? ?Apgar scores:  9  at 1 minute ?    9  at 5 minutes ?     ?LABOR/DELIVERY Comments: Requested by ED to support team caring for a mother in active labor of an EGA 36w baby with limited PNC.  Midwife in attendance.  Delivery precipitous before our team's arrival.  Patient reportedly did very well at birth per ED nurse (with L&D experience).  Mother not interested in holding/seeing infant; reportedly desires immediate severance of ties and for baby to placed up for adoption.  Baby brought immediately to warmer and dried and stimulated.  Cord clamped and cut.  Upon our arrival at ~15-20min, baby wrapped and well-appearing.  Pink, active, alert, no wob, CTAB, abd soft, nl spine, palate intact, good suck, grasp, and moro. Appears 36-[redacted] weeks EGA. ? ?Neonatologist: Nathanael Krist ?NNP at delivery:  n/a ?Others at delivery:  Ashley, Kevin ? ? ?ASSESSMENT/PLAN:  Late preterm-early term infant boy who appears to be transitioning well.  Admit to Mother/Baby Unit under care of pediatrician for routine care.  Support family; mother strongly considering adoption.  Follow maternal labs.  ? ?______________________ ?Electronically Signed By: ? ?Desiree Arruda C. Jeslin Bazinet, MD ?Neonatologist ?10/29/2021, 8:17 PM ? ?

## 2021-10-29 NOTE — ED Provider Notes (Addendum)
?[place] DEPT ?Provider Note ? ? ?CSN: [id number] ?Arrival date & time: 10/29/21  1718 ? ?  ? ?History ? ?Chief Complaint  ?Patient presents with  ? Abdominal Pain  ? Pelvic Pain  ? ? ?Desiree Sherman is a 26 y.o. female. ? ? ?Abdominal Pain ?Associated symptoms: vaginal bleeding   ?Pelvic Pain ?Associated symptoms include abdominal pain.  ? ?26 year old female G3 P3 who presents to the emergency department with abdominal pain in her lower abdomen and vaginal bleeding.  The patient is not sure if she is pregnant.  She states her last menstrual period was 1 month ago.  She was last sexually active roughly 1 month ago.  She endorses pain and cramping in her lower abdomen that has been bothering her all day.  She has gone through several pads throughout the day with passage of bloody clots..  ? ?Home Medications ?Prior to Admission medications   ?Medication Sig Start Date End Date Taking? Authorizing Provider  ?acetaminophen (TYLENOL) 500 MG tablet Take 1,000 mg by mouth every 6 (six) hours as needed for mild pain or headache.    [provider]  ?amoxicillin (AMOXIL) 500 MG capsule Take 1 capsule (500 mg total) by mouth 3 (three) times daily. 08/20/20   Montine Circle, PA-C  ?ibuprofen (ADVIL) 600 MG tablet Take 1 tablet (600 mg total) by mouth every 6 (six) hours. 08/30/20   Meisinger, Sherren Mocha, MD  ?   ? ?Allergies    ?Patient has no known allergies.   ? ?Review of Systems   ?Review of Systems  ?Gastrointestinal:  Positive for abdominal pain.  ?Genitourinary:  Positive for pelvic pain and vaginal bleeding.  ?All other systems reviewed and are negative. ? ?Physical Exam ?Updated Vital Signs ?BP 111/61 (BP Location: Left Arm)   Pulse 87   Temp 98.4 ?F (36.9 ?C) (Oral)   Resp 20   SpO2 100%   Breastfeeding Unknown  ?Physical Exam ?Vitals and nursing note reviewed.  ?Constitutional:   ?   General: She is not in acute distress. ?HENT:  ?   Head: Normocephalic and  atraumatic.  ?Eyes:  ?   Conjunctiva/sclera: Conjunctivae normal.  ?   Pupils: Pupils are equal, round, and reactive to light.  ?Cardiovascular:  ?   Rate and Rhythm: Regular rhythm. Tachycardia present.  ?Pulmonary:  ?   Effort: Pulmonary effort is normal. No respiratory distress.  ?Abdominal:  ?   General: There is no distension.  ?   Tenderness: There is abdominal tenderness in the right lower quadrant, suprapubic area and left lower quadrant. There is guarding. There is no rebound.  ?   Comments: Abdomen distended, appears gravid  ?Musculoskeletal:     ?   General: No deformity or signs of injury.  ?   Cervical back: Neck supple.  ?Skin: ?   Findings: No lesion or rash.  ?Neurological:  ?   General: No focal deficit present.  ?   Mental Status: She is alert. Mental status is at baseline.  ? ? ?ED Results / Procedures / Treatments   ?Labs ?(all labs ordered are listed, but only abnormal results are displayed) ?Labs Reviewed  ?COMPREHENSIVE METABOLIC PANEL - Abnormal; Notable for the following components:  ?    Result Value  ? CO2 17 (*)   ? Albumin 3.1 (*)   ? Alkaline Phosphatase 140 (*)   ? All other components within normal limits  ?CBC - Abnormal; Notable for the following components:  ?  WBC 14.8 (*)   ? Hemoglobin 10.9 (*)   ? HCT 35.0 (*)   ? MCV 79.5 (*)   ? MCH 24.8 (*)   ? RDW 16.1 (*)   ? All other components within normal limits  ?DIFFERENTIAL - Abnormal; Notable for the following components:  ? Neutro Abs 12.5 (*)   ? Abs Immature Granulocytes 0.21 (*)   ? All other components within normal limits  ?I-STAT BETA HCG BLOOD, ED ([facility], [facility], [facility] ONLY) - Abnormal; Notable for the following components:  ? I-stat hCG, quantitative >2,000.0 (*)   ? All other components within normal limits  ?LIPASE, BLOOD  ?RAPID HIV SCREEN (HIV 1/2 AB+AG)  ?HEPATITIS B SURFACE ANTIGEN  ?HEPATITIS C ANTIBODY  ?CBC  ?HIV ANTIBODY (ROUTINE TESTING W REFLEX)  ?RPR  ?RUBELLA SCREEN  ?TYPE AND SCREEN  ? ? ?EKG ?None ? ?Radiology ?US OB  Limited ? ?Result Date: 10/29/2021 ?CLINICAL DATA:  Abdominal pain for 1 day EXAM: LIMITED OBSTETRIC ULTRASOUND COMPARISON:  None. FINDINGS: Number of Fetuses: 1 Heart Rate:  120 bpm Movement: No Presentation: Cephalic Placental Location: Anterior Previa: No Amniotic Fluid (Subjective): Severe oligohydramnios, with no significant pocket of amniotic fluid identified. FL: 7.1 cm 36 w  2 d MATERNAL FINDINGS: Cervix:  Not well visualized. Uterus/Adnexae: No abnormality visualized. IMPRESSION: 1. Single live intrauterine pregnancy in cephalic presentation, estimated age 19 weeks and 2 days. 2. Severe oligohydramnios, with no measurable pocket of amniotic fluid identified. This severely limits evaluation of the fetus. This exam is performed on an emergent basis and does not comprehensively evaluate fetal size, dating, or anatomy; follow-up complete OB US should be considered if further fetal assessment is warranted. Electronically Signed   By: Randa Ngo M.D.   On: 10/29/2021 18:51   ? ?Procedures ?Ultrasound ED Abd ? ?Date/Time: 10/30/2021 1:20 AM ?Performed by: Regan Lemming, MD ?Authorized by: Regan Lemming, MD  ? ?Procedure details:  ?  Indications: abdominal pain    ?  Images: not archived ?   ?Comments:  ?   Fetal heart tones noted at 503, cephalic presentation ?.Critical Care ?Performed by: Regan Lemming, MD ?Authorized by: Regan Lemming, MD  ? ?Critical care provider statement:  ?  Critical care time (minutes):  45 ?  Critical care was necessary to treat or prevent imminent or life-threatening deterioration of the following conditions: Labor and Delivery. ?  Critical care was time spent personally by me on the following activities:  Development of treatment plan with patient or surrogate, discussions with consultants, evaluation of patient's response to treatment, examination of patient, ordering and review of laboratory studies, ordering and review of radiographic studies, ordering and performing treatments  and interventions, pulse oximetry, re-evaluation of patient's condition and review of old charts ?  Care discussed with: admitting provider    ? ? ?Medications Ordered in ED ?Medications  ?ibuprofen (ADVIL) tablet 600 mg (600 mg Oral Not Given 10/29/21 2353)  ?acetaminophen (TYLENOL) tablet 650 mg (has no administration in time range)  ?diphenhydrAMINE (BENADRYL) capsule 25 mg (has no administration in time range)  ?senna-docusate (Senokot-S) tablet 2 tablet (has no administration in time range)  ?simethicone (MYLICON) chewable tablet 80 mg (has no administration in time range)  ?ondansetron (ZOFRAN) tablet 4 mg (has no administration in time range)  ?  Or  ?ondansetron (ZOFRAN) injection 4 mg (has no administration in time range)  ?prenatal multivitamin tablet 1 tablet (has no administration in time range)  ?witch hazel-glycerin (TUCKS) pad 1 application. (has  no administration in time range)  ?  And  ?dibucaine (NUPERCAINAL) 1 % rectal ointment 1 application. (has no administration in time range)  ?benzocaine-Menthol (DERMOPLAST) 20-0.5 % topical spray 1 application. (has no administration in time range)  ?coconut oil (has no administration in time range)  ?measles, mumps & rubella vaccine (MMR) injection 0.5 mL (has no administration in time range)  ?Tdap (BOOSTRIX) injection 0.5 mL (has no administration in time range)  ?lactated ringers bolus 1,000 mL (1,000 mLs Intravenous New Bag/Given 10/29/21 1902)  ?fentaNYL (SUBLIMAZE) 50 MCG/ML injection (50 mcg  Given 10/29/21 1920)  ?misoprostol (CYTOTEC) tablet 1,000 mcg (1,000 mcg Rectal Given 10/29/21 1932)  ?tranexamic acid (CYKLOKAPRON) IVPB 1,000 mg (1,000 mg Intravenous New Bag/Given 10/29/21 1951)  ? ? ?ED Course/ Medical Decision Making/ A&P ?Clinical Course as of 10/30/21 0129  ?Sun Oct 29, 2021  ?1832 I-stat hCG, quantitative(!): >2,000.0 [JL]  ?  ?Clinical Course User Index ?[JL] Regan Lemming, MD  ? ?                        ?Medical Decision Making ?Amount  and/or Complexity of Data Reviewed ?Labs: ordered. Decision-making details documented in ED Course. ?Radiology: ordered. ? ?Risk ?Prescription drug management. ? ? ? ?26 year old female G3 P3 who presents to the e

## 2021-10-29 NOTE — H&P (Signed)
LABOR AND DELIVERY ADMISSION HISTORY AND PHYSICAL NOTE ? ?Desiree Sherman is a 26 y.o. female [phone]. She presents from [facility] following precipitous delivery in their facility. She did not receive prenatal care during this pregnancy. She denies history of HTN or DM. Patient did not know she was pregnant.  ? ?She plans on bottle feeding. Her contraception plan is: undecided. ? ?Prenatal History/Complications: ? ?Sono:  @[redacted]w[redacted]d , CWD, normal anatomy, Vertex presentation, Anterior placenta ? ?Pregnancy complications:  ?- No prenatal care ?- Hz depression with suicidal ideation ? ?Past Medical History: ?Past Medical History:  ?Diagnosis Date  ? Anxiety   ? Chlamydia   ? during pregnancy  ? Depression   ? ? ?Past Surgical History: ?Past Surgical History:  ?Procedure Laterality Date  ? CHEST SURGERY    ? fell onto glass table, glass removed  ? ? ?Obstetrical History: ?OB History   ? ? Gravida  ?4  ? Para  ?3  ? Term  ?3  ? Preterm  ?   ? AB  ?   ? Living  ?3  ?  ? ? SAB  ?   ? IAB  ?   ? Ectopic  ?   ? Multiple  ?0  ? Live Births  ?3  ?   ?  ?  ? ? ?Social History: ?Social History  ? ?Socioeconomic History  ? Marital status: Single  ?  Spouse name: Not on file  ? Number of children: Not on file  ? Years of education: Not on file  ? Highest education level: Not on file  ?Occupational History  ? Not on file  ?Tobacco Use  ? Smoking status: Never  ? Smokeless tobacco: Never  ?Vaping Use  ? Vaping Use: Never used  ?Substance and Sexual Activity  ? Alcohol use: No  ? Drug use: No  ? Sexual activity: Not Currently  ?Other Topics Concern  ? Not on file  ?Social History Narrative  ? Not on file  ? ?Social Determinants of Health  ? ?Financial Resource Strain: Not on file  ?Food Insecurity: Not on file  ?Transportation Needs: Not on file  ?Physical Activity: Not on file  ?Stress: Not on file  ?Social Connections: Not on file  ? ? ?Family History: ?Family History  ?Problem Relation Age of Onset  ? Asthma Mother    ? ? ?Allergies: ?No Known Allergies ? ?Medications Prior to Admission  ?Medication Sig Dispense Refill Last Dose  ? acetaminophen (TYLENOL) 500 MG tablet Take 1,000 mg by mouth every 6 (six) hours as needed for mild pain or headache.     ? amoxicillin (AMOXIL) 500 MG capsule Take 1 capsule (500 mg total) by mouth 3 (three) times daily. 30 capsule 0   ? ibuprofen (ADVIL) 600 MG tablet Take 1 tablet (600 mg total) by mouth every 6 (six) hours. 30 tablet 0   ? ? ? ?Review of Systems  ?All systems reviewed and negative except as stated in HPI ? ?Physical Exam ?Blood pressure (!) 122/92, pulse 92, temperature 98.1 ?F (36.7 ?C), temperature source Oral, resp. rate (!) 21, SpO2 99 %, unknown if currently breastfeeding. ?General appearance: alert, oriented ?Lungs: normal respiratory effort ?Heart: regular rate ?Abdomen: Fundus firm, appropriately TTP for immediate postpartum ?Extremities: No calf swelling or tenderness ? ?Dilation: 10 ?Effacement (%): 100 ? ?Prenatal labs: ?ABO, Rh:  In work ?Antibody:  In work ?Rubella:  In work ?RPR:   In work ?HBsAg:    ?HIV:   Neg ?  GBS:   Unknown ?2-hr GTT: N/A no prenatal care ? ? ?Prenatal Transfer Tool  ?Maternal Diabetes: No ?Genetic Screening: Declined (no prenatal care) ?Maternal Ultrasounds/Referrals: Normal ?Fetal Ultrasounds or other Referrals:  N/A no prenatal care ?Maternal Substance Abuse:  Unknown ?Significant Maternal Medications:  None ?Significant Maternal Lab Results: GBS unknown ? ?Results for orders placed or performed during the hospital encounter of 10/29/21 (from the past 24 hour(s))  ?Lipase, blood  ? Collection Time: 10/29/21  5:39 PM  ?Result Value Ref Range  ? Lipase 23 11 - 51 U/L  ?Comprehensive metabolic panel  ? Collection Time: 10/29/21  5:39 PM  ?Result Value Ref Range  ? Sodium 136 135 - 145 mmol/L  ? Potassium 3.7 3.5 - 5.1 mmol/L  ? Chloride 110 98 - 111 mmol/L  ? CO2 17 (L) 22 - 32 mmol/L  ? Glucose, Bld 90 70 - 99 mg/dL  ? BUN 7 6 - 20 mg/dL  ?  Creatinine, Ser 0.51 0.44 - 1.00 mg/dL  ? Calcium 8.9 8.9 - 10.3 mg/dL  ? Total Protein 7.1 6.5 - 8.1 g/dL  ? Albumin 3.1 (L) 3.5 - 5.0 g/dL  ? AST 18 15 - 41 U/L  ? ALT 12 0 - 44 U/L  ? Alkaline Phosphatase 140 (H) 38 - 126 U/L  ? Total Bilirubin 0.4 0.3 - 1.2 mg/dL  ? GFR, Estimated >60 >60 mL/min  ? Anion gap 9 5 - 15  ?CBC  ? Collection Time: 10/29/21  5:39 PM  ?Result Value Ref Range  ? WBC 14.8 (H) 4.0 - 10.5 K/uL  ? RBC 4.40 3.87 - 5.11 MIL/uL  ? Hemoglobin 10.9 (L) 12.0 - 15.0 g/dL  ? HCT 35.0 (L) 36.0 - 46.0 %  ? MCV 79.5 (L) 80.0 - 100.0 fL  ? MCH 24.8 (L) 26.0 - 34.0 pg  ? MCHC 31.1 30.0 - 36.0 g/dL  ? RDW 16.1 (H) 11.5 - 15.5 %  ? Platelets 229 150 - 400 K/uL  ? nRBC 0.0 0.0 - 0.2 %  ?I-Stat beta hCG blood, ED  ? Collection Time: 10/29/21  5:53 PM  ?Result Value Ref Range  ? I-stat hCG, quantitative >2,000.0 (H) <5 mIU/mL  ? Comment 3          ?Differential  ? Collection Time: 10/29/21  6:50 PM  ?Result Value Ref Range  ? Neutrophils Relative % 84 %  ? Neutro Abs 12.5 (H) 1.7 - 7.7 K/uL  ? Lymphocytes Relative 10 %  ? Lymphs Abs 1.5 0.7 - 4.0 K/uL  ? Monocytes Relative 5 %  ? Monocytes Absolute 0.7 0.1 - 1.0 K/uL  ? Eosinophils Relative 0 %  ? Eosinophils Absolute 0.0 0.0 - 0.5 K/uL  ? Basophils Relative 0 %  ? Basophils Absolute 0.0 0.0 - 0.1 K/uL  ? Immature Granulocytes 1 %  ? Abs Immature Granulocytes 0.21 (H) 0.00 - 0.07 K/uL  ? ? ?Patient Active Problem List  ? Diagnosis Date Noted  ? Pregnancy 08/28/2020  ? Term pregnancy 12/30/2017  ? SVD (spontaneous vaginal delivery) 12/30/2017  ? Postpartum care following vaginal delivery 12/30/2017  ? Depression 11/08/2017  ? Severe major depression, single episode, without psychotic features ([place]) 11/08/2017  ? Depression affecting pregnancy, antepartum 11/07/2017  ? Depression with suicidal ideation 11/07/2017  ? ? ?Assessment: ?Desiree Sherman is a 26 y.o. [phone] admitted to postpartum s/p precipitous delivery and recovery at [facility] ? ?--Prenatal  labs collected at [facility] ? ?--See separate delivery note ? ?--  Admit to Postpartum  ? ?Darlina Rumpf, CNM ?10/29/2021, 8:25 PM ? ? ?

## 2021-10-29 NOTE — Discharge Instructions (Signed)

## 2021-10-29 NOTE — Progress Notes (Signed)
Notified of precipitous delivery at [facility] by LD staff. Arrival to assume care at 1852 as OB RR RN with report from [facility], RN-C present at bedside.  ? ?Pt completely dilated and pushing for approximately 5 minutes prior to arrival. No prenatal care. Estimated 36 wks. Attending OB notified per staff.  1907 Dr. Vergie Living contacted, states Thalia Bloodgood, CNM is on her way. ?18 Thalia Bloodgood, CNM present at bedside in [facility].  SVD delivery at 1910, viable female. ?Infant placed skin to skin following SVD, dried and stimulated with spontaneously cry, mother requested infant go to warmer with RN staff.  ? ?Placenta delivered via CNM, multiple clots to follow with suspected placental abruption.  Placenta intact per CNM. EBL 250cc. Intact perineum.  ? ?Patient given fentanyl IV for pain relief. IV Pit initiated at 1912.  1000 mcg cytotec rectally administered at 1932. IV TXA administered at 1951.  Fundus is firm, one below umbilicus, scant bleeding. Ice pack given.  ?Cord segment sent.  No cord blood drawn following delivery. Placenta sent to LD.  ? ?Jordan Hawks, RN ?OB RR  ? ?

## 2021-10-29 NOTE — ED Triage Notes (Signed)
Patient reports pelvic pain and lower abdominal pain today. States vaginal bleeding started today. LMP beginning of March per patient. ?

## 2021-10-30 LAB — CBC
HCT: 29.8 % — ABNORMAL LOW (ref 36.0–46.0)
Hemoglobin: 9.4 g/dL — ABNORMAL LOW (ref 12.0–15.0)
MCH: 24.7 pg — ABNORMAL LOW (ref 26.0–34.0)
MCHC: 31.5 g/dL (ref 30.0–36.0)
MCV: 78.4 fL — ABNORMAL LOW (ref 80.0–100.0)
Platelets: 186 10*3/uL (ref 150–400)
RBC: 3.8 MIL/uL — ABNORMAL LOW (ref 3.87–5.11)
RDW: 15.9 % — ABNORMAL HIGH (ref 11.5–15.5)
WBC: 17.3 10*3/uL — ABNORMAL HIGH (ref 4.0–10.5)
nRBC: 0.2 % (ref 0.0–0.2)

## 2021-10-30 LAB — HIV ANTIBODY (ROUTINE TESTING W REFLEX): HIV Screen 4th Generation wRfx: NONREACTIVE

## 2021-10-30 LAB — HEPATITIS C ANTIBODY: HCV Ab: NONREACTIVE

## 2021-10-30 LAB — RPR: RPR Ser Ql: NONREACTIVE

## 2021-10-30 NOTE — Clinical Social Work Maternal (Addendum)
?CLINICAL SOCIAL WORK MATERNAL/CHILD NOTE ? ?Patient Details  ?Name: Desiree Sherman ?MRN: [id number] ?Date of Birth: 12/09/1995 ? ?Date:  10/30/2021 ? ?Clinical Social Worker Initiating Note:  Mubashir Mallek, LCSW Date/Time: Initiated:  10/30/21/1224    ? ?Child's Name:   (Unknown)  ? ?Biological Parents:  Mother, Father (MOB: Kmari Jennings 12/02/1995, FOB: Tevin Strayhorn 09-20-1991)  ? ?Need for Interpreter:  None  ? ?Reason for Referral:  Late or No Prenatal Care  , Recent Intentional Overdose  , Behavioral Health Concerns  ? ?Address:  [address] ?[place] [address]  ?  ?Phone number:  [phone] (home)    ? ?Additional phone number:  ? ?Household Members/Support Persons (HM/SP):   Household Member/Support Person 1, Household Member/Support Person 2, Household Member/Support Person 3, Household Member/Support Person 4 ? ? ?HM/SP Name Relationship DOB or Age  ?HM/SP -1 Tevin Strayhorn Significant Other 09-20-1991  ?HM/SP -2 Kaleb Cutshaw Winn Son 12-30-2017  ?HM/SP -3 Ter'Ryah Schult Winn Daughter 04-22-2014  ?HM/SP -4 Harmony Strayhorn Daughter 08-28-2020  ?HM/SP -5        ?HM/SP -6        ?HM/SP -7        ?HM/SP -8        ? ? ?Natural Supports (not living in the home):     ? ?Professional Supports: None  ? ?Employment: Full-time  ? ?Type of Work: [employer]  ? ?Education:  High school graduate  ? ?Homebound arranged:   ? ?Financial Resources:  Medicaid  ? ?Other Resources:  Food Stamps    ? ?Cultural/Religious Considerations Which May Impact Care:   ? ?Strengths:  Ability to meet basic needs  , Home prepared for child  , Pediatrician chosen  ? ?Psychotropic Medications:        ? ?Pediatrician:    [place] area ? ?Pediatrician List:  ? ?[place] [facility]  ?[place]    ?[place]    ?[place]    ?[place]    ?[place]    ? ? ?Pediatrician Fax Number:   ? ?Risk Factors/Current Problems:  None  ? ?Cognitive State:  Able to Concentrate   , Alert  , Insightful  , Linear Thinking    ? ?Mood/Affect:  Comfortable  , Calm    ? ?CSW Assessment: CSW received consult for, No PNC-pregnancy unknown, hx depression/SI, Parents are interested in relinquishing infant for adoption  CSW met with MOB to offer support and complete assessment.  ? ?CSW met with MOB at bedside and introduced CSW role. CSW observed MOB in bed, the infant asleep in bassinet, FOB (Tevin Strayhorn) at bedside and a minor child asleep on the couch. MOB presented calm and was receptive to CSW visit. CSW offered MOB privacy. CSW asked how MOB felt. FOB left the room to allow privacy. MOB reported feeling ?fine.? MOB confirmed that the demographic information on hospital file is correct. MOB reported she, FOB and her children live in the home together. MOB identified FOB as her only support. MOB reported she works at [employer]. CSW asked infant's name. MOB reported that she and FOB have not chosen a name for the infant.  ? ?MOB reported that the ?infant was not expected.? MOB shared she was not aware that she was pregnant and did not receive PNC. MOB disclosed that she and FOB cannot financially support another child. MOB shared that she and FOB would like for FOB's parents to adopt the child. CSW informed MOB   that she will have to contact the appropriate agency to process and coordinate the private adoption with paternal grandparents. MOB reported understanding and requested CSW explain this to FOB. CSW inquired if MOB would be able to get items for the infant. MOB reported that FOB's parents would be able to get a car seat and other items. CSW informed MOB that the hospital can provide a pack n play for the infant to sleep. MOB was appreciative and expressed that the pack n play will be helpful. CSW informed MOB about [facility] and educated her about their services. MOB reported understanding. CSW inquired if MOB receives WIC/FS. MOB reported she gets FS benefits and has used WIC in  the past for her other children. MOB reported she plans to bottle feed the infant, so she will purchase formula with food stamps until she can apply for WIC.  ? ?CSW informed MOB about the hospital drug screen policy. MOB made aware that CSW will monitor the infant's UDS/CDS and make a report to CPS, if warranted. MOB reported understanding and denied substance use during the pregnancy. MOB denied CPS history.  ? ?CSW inquired about MOB mental health history. MOB denied history of depression. CSW inquired about MOB history of depression. MOB reported, ?I had depression a long time ago and has not had any concerns since then.? MOB reported that she had depression while pregnant with her son in 2019. MOB reported [facility] and was prescribed Zoloft treat symptoms. MOB reported she no longer takes the medication but is willing to take it again, if needed. MOB reported she plans to reestablish care with her primary care provider in the coming weeks. CSW discussed PPD. 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 CSW provided education regarding the baby blues period vs. perinatal mood disorders, discussed treatment and gave resources for mental health follow up if concerns arise.  CSW recommended at self-evaluation during the postpartum time period using the New Mom Checklist from Postpartum Progress and encouraged MOB to contact a medical professional if symptoms are noted at any time. MOB reported she feels comfortable reaching out to a medical professional if she has concerns. CSW assessed MOB for safety. MOB denied thoughts of harm to self and others.  ? ?CSW provided review of Sudden Infant Death Syndrome (SIDS) precautions. MOB reported understanding. MOB has chosen [facility] for the infant follow up care and will have transportation to the appointments.  ? ?Per MOB request. CSW explained that the hospital provides a list of  adoption agencies but does coordinate private adoptions. CSW explained that MOB and FOB will have to contact the appropriate agency to coordinate the adoption with paternal grandparents. FOB reported understanding.  FOB reported they prefer to use ?people that they know.? FOB requested a copy of the adoption agency list for reference. CSW provided FOB with the agency adoption list.  ? ?CSW identifies no further need for intervention and no barriers to discharge at this time.  ? ?CSW will continue to monitor the infant's UDS/CDS and make a report to CPS, if warranted.  ? ?CSW Plan/Description:  Sudden Infant Death Syndrome (SIDS) Education, CSW Will Continue to Monitor Umbilical Cord Tissue Drug Screen Results and Make Report if Warranted, Other Information/Referral to Community Resources, Hospital Drug Screen Policy Information, Perinatal Mood and Anxiety Disorder (PMADs) Education, No Further Intervention Required/No Barriers to Discharge  ? ? ?Byrne Capek A Loan Oguin, LCSW ?10/30/2021,   1:35 PM ?

## 2021-10-30 NOTE — Progress Notes (Signed)
Patient ID: Desiree Sherman, female   DOB: June 21, 1996, 26 y.o.   MRN: [id number] ?Post Partum Day #1 ?Subjective: ?no complaints ?Doing well; is holding baby now after not initially wanting to see baby ?Objective: ?Blood pressure (!) 108/55, pulse 71, temperature 98.3 ?F (36.8 ?C), temperature source Oral, resp. rate 16, SpO2 100 %, unknown if currently breastfeeding. ? ?Physical Exam:  ?General: alert, cooperative, and no distress ?Lochia: appropriate ?Uterine Fundus: firm ?Incision: healing well, NA ?DVT Evaluation: No evidence of DVT seen on physical exam. ? ?Recent Labs  ?  10/29/21 ?1739 10/30/21 ?0515  ?HGB 10.9* 9.4*  ?HCT 35.0* 29.8*  ? ? ?Assessment/Plan: ?Plan for discharge tomorrow ?Plan for social work consult today ? LOS: 1 day  ? ?Desiree Sherman ?10/30/2021, 7:14 AM  ? ? ?

## 2021-10-31 LAB — RUBELLA SCREEN: Rubella: 4.89 index (ref >0.99)

## 2021-10-31 MED ORDER — IBUPROFEN 600 MG PO TABS: 600.0000 mg | ORAL_TABLET | Freq: Four times a day (QID) | ORAL | 0 refills | Status: AC

## 2021-10-31 MED ORDER — MEDROXYPROGESTERONE ACETATE 150 MG/ML IM SUSP
150.0000 mg | Freq: Once | INTRAMUSCULAR | Status: AC
  Administered 2021-10-31: 150 mg via INTRAMUSCULAR
  Filled 2021-10-31: qty 1

## 2021-11-07 ENCOUNTER — Telehealth (HOSPITAL_COMMUNITY): Payer: Self-pay

## 2021-11-07 NOTE — Telephone Encounter (Signed)
No answer. Left message to return nurse call. ? Jerald Kief ?11/07/2021,1534 ?

## 2021-11-30 ENCOUNTER — Encounter: Payer: Self-pay | Admitting: Obstetrics and Gynecology

## 2021-11-30 ENCOUNTER — Ambulatory Visit: Payer: Medicaid Other | Admitting: Obstetrics and Gynecology

## 2021-12-03 NOTE — Progress Notes (Signed)
Patient did not keep her postpartum appointment for 11/30/2021.  Durene Romans MD Attending Center for [employer] farm manager)

## 2022-10-08 ENCOUNTER — Encounter: Payer: Medicaid Other | Admitting: Obstetrics and Gynecology

## 2022-10-23 ENCOUNTER — Encounter: Payer: Medicaid Other | Admitting: Advanced Practice Midwife

## 2022-11-02 ENCOUNTER — Ambulatory Visit: Payer: Medicaid Other | Admitting: Family Medicine

## 2022-11-10 ENCOUNTER — Ambulatory Visit (HOSPITAL_COMMUNITY): Payer: Self-pay

## 2022-11-16 ENCOUNTER — Ambulatory Visit: Payer: Medicaid Other | Admitting: Family Medicine

## 2023-01-01 ENCOUNTER — Ambulatory Visit: Payer: Medicaid Other | Admitting: Internal Medicine

## 2023-01-17 IMAGING — US US OB LIMITED
1 series · 15 of 28 positions shown · non-contrast
Comparison: None.

CLINICAL DATA: Abdominal pain for 1 day

EXAM:
LIMITED OBSTETRIC ULTRASOUND

[Series 1: us ob comp less 14 wks mc & wl · 48 acquisitions, 15 frames shown]
[im 1/48]
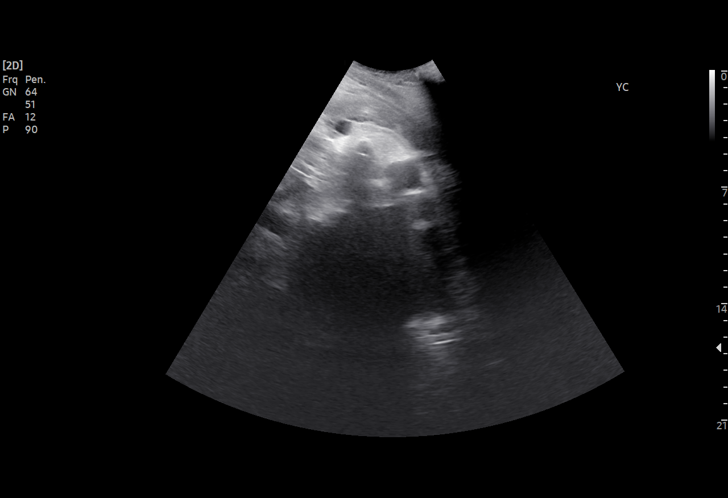
[im 4/48]
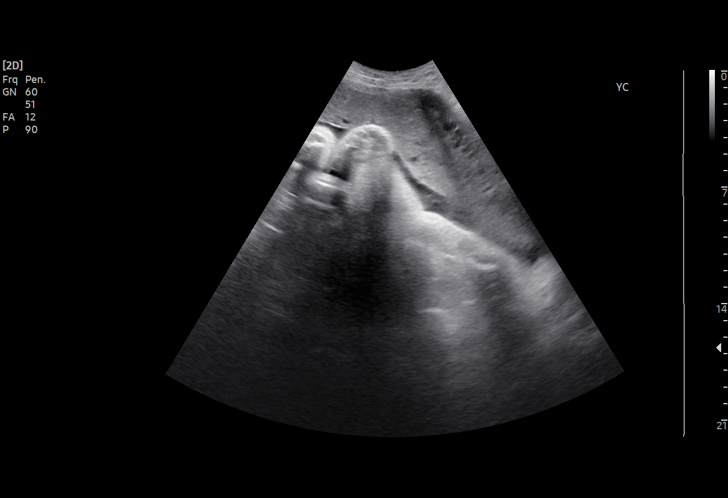
[im 7/48]
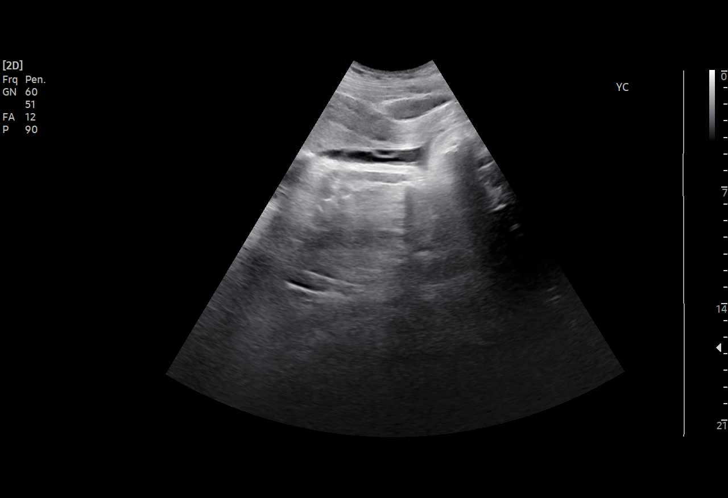
[im 11/48]
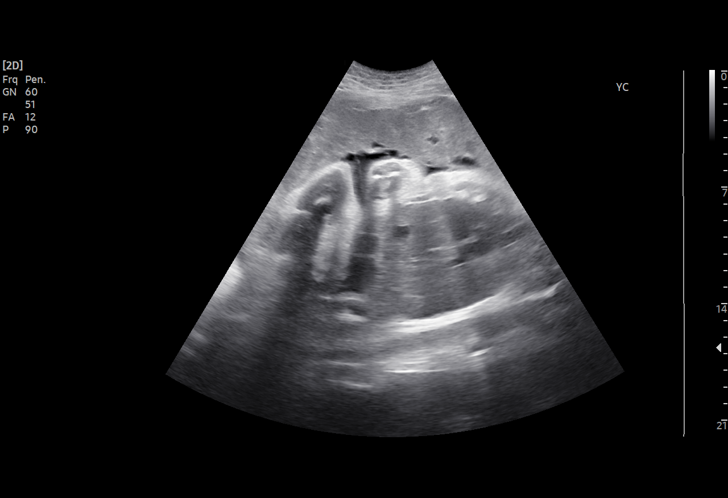
[im 14/48]
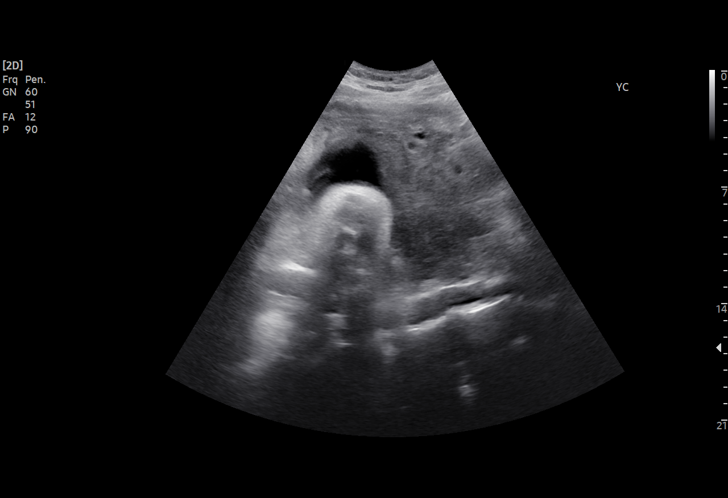
[im 18/48]
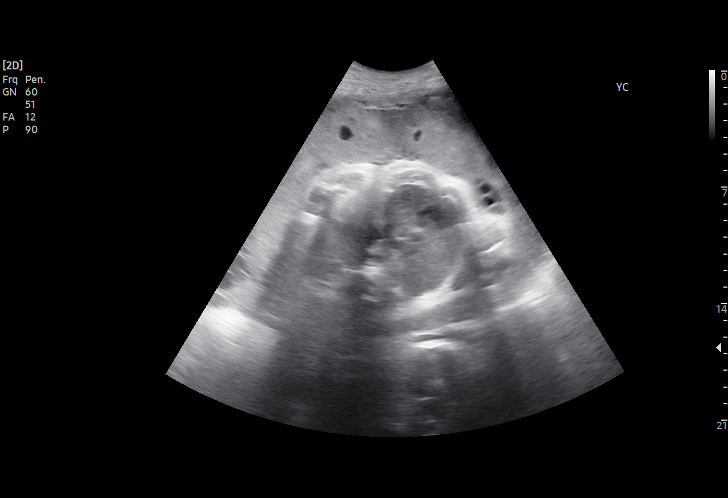
[im 21/48]
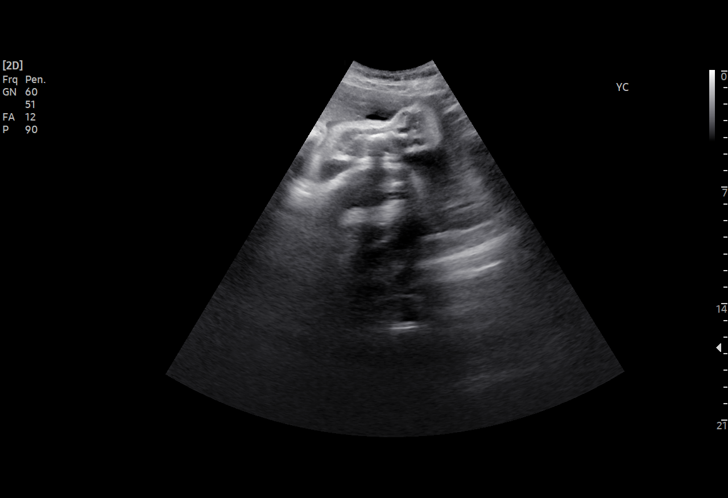
[im 25/48]
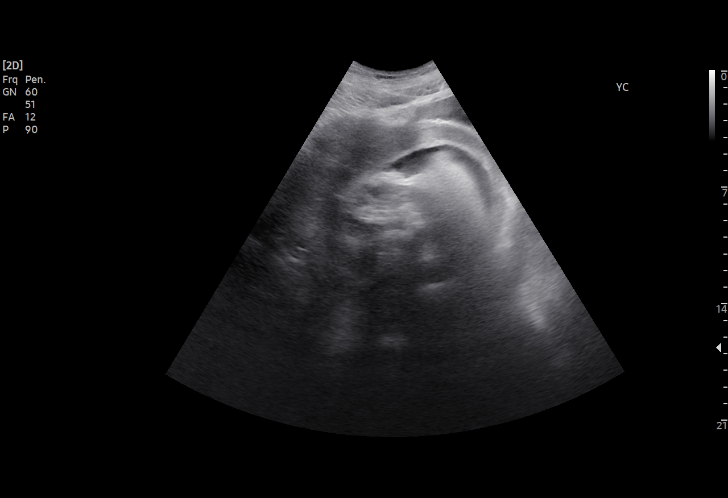
[im 27/48]
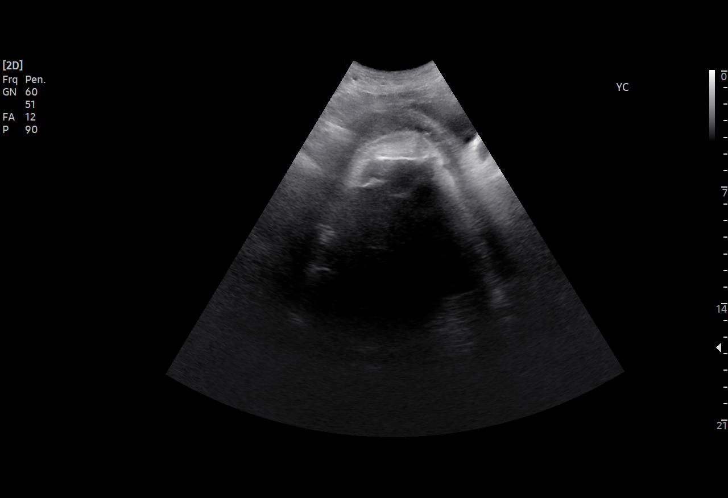
[im 30/48]
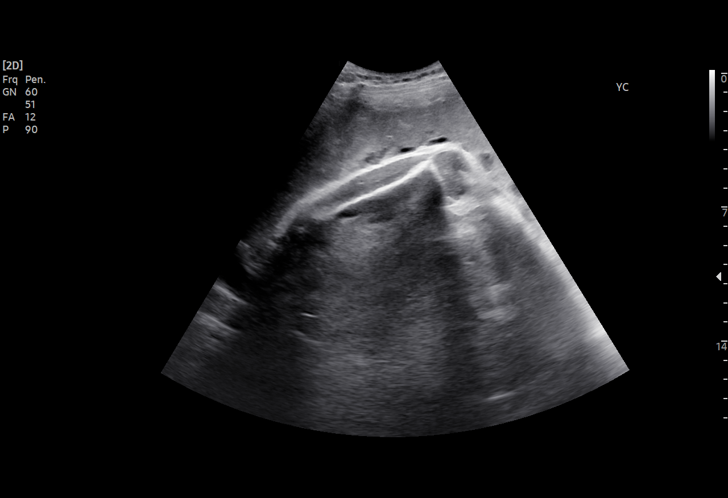
[im 34/48]
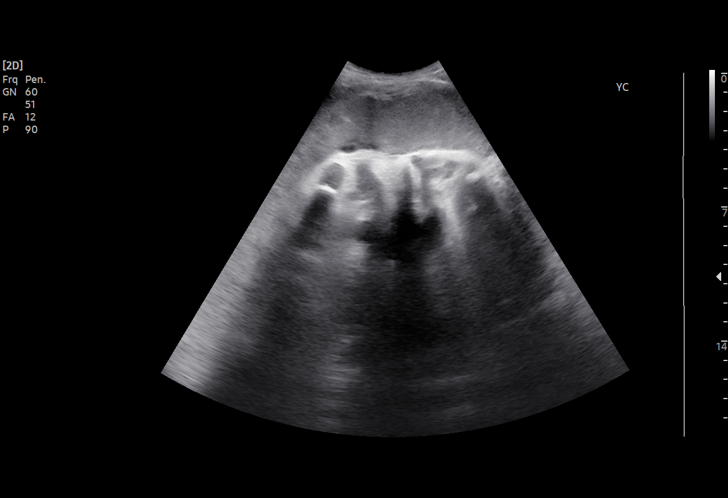
[im 37/48]
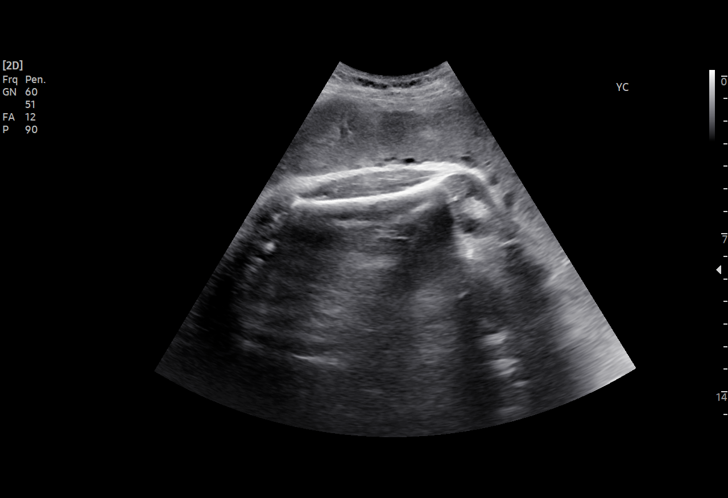
[im 41/48]
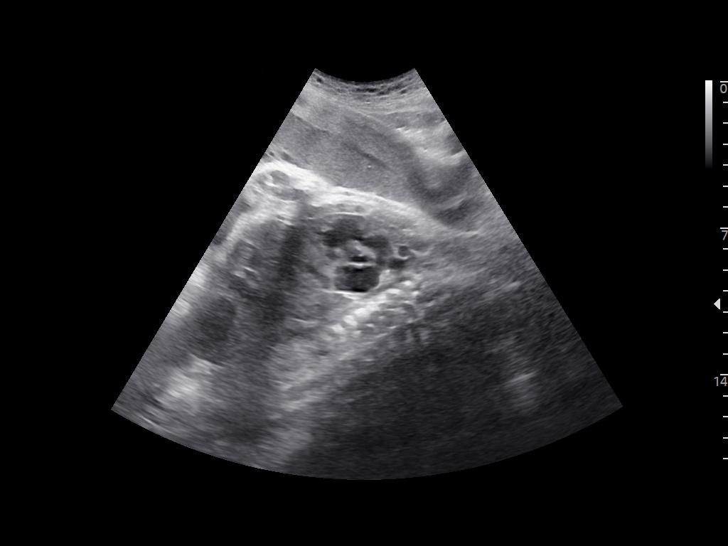
[im 44/48]
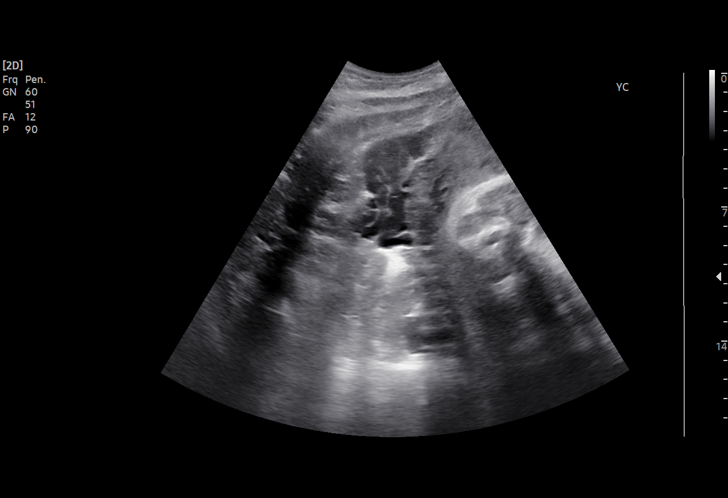
[im 48/48]
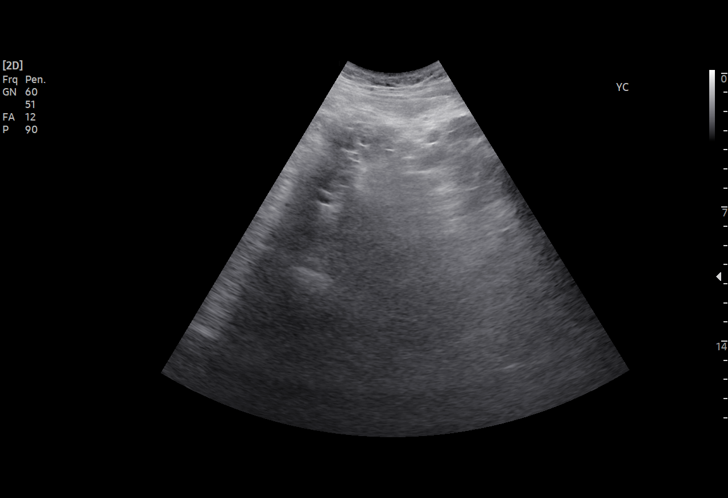

[15 of 28 positions shown; findings below may reference images not displayed]

FINDINGS: Number of Fetuses: 1

Heart Rate:  120 bpm

Movement: No

Presentation: Cephalic

Placental Location: Anterior

Previa: No

Amniotic Fluid (Subjective): Severe oligohydramnios, with no
significant pocket of amniotic fluid identified.

FL: 7.1 cm 36 w  2 d

MATERNAL FINDINGS:

Cervix:  Not well visualized.

Uterus/Adnexae: No abnormality visualized.
IMPRESSION: 1. Single live intrauterine pregnancy in cephalic presentation,
estimated age 36 weeks and 2 days.
2. Severe oligohydramnios, with no measurable pocket of amniotic
fluid identified. This severely limits evaluation of the fetus.

This exam is performed on an emergent basis and does not
comprehensively evaluate fetal size, dating, or anatomy; follow-up
complete OB US should be considered if further fetal assessment is
warranted.

## 2023-01-18 ENCOUNTER — Ambulatory Visit (HOSPITAL_COMMUNITY): Payer: Medicaid Other

## 2023-04-11 ENCOUNTER — Ambulatory Visit: Payer: Medicaid Other | Admitting: Family Medicine

## 2023-05-20 ENCOUNTER — Telehealth: Payer: Self-pay | Admitting: Family Medicine

## 2023-05-20 NOTE — Telephone Encounter (Signed)
I tried to reach patient to get her scheduled for an annual appointment. Number is not in service

## 2023-05-22 ENCOUNTER — Ambulatory Visit: Payer: Medicaid Other | Admitting: Nurse Practitioner
